# Patient Record
Sex: Female | Born: 1945 | Race: Black or African American | Hispanic: No | Marital: Married | Smoking: Former smoker
Health system: Southern US, Community
[De-identification: ages and names within clinical notes are randomized; demographics above are authoritative.]

## PROBLEM LIST (undated history)

## (undated) DIAGNOSIS — H903 Sensorineural hearing loss, bilateral: Secondary | ICD-10-CM

## (undated) DIAGNOSIS — K7460 Unspecified cirrhosis of liver: Secondary | ICD-10-CM

## (undated) DIAGNOSIS — I10 Essential (primary) hypertension: Secondary | ICD-10-CM

## (undated) DIAGNOSIS — E119 Type 2 diabetes mellitus without complications: Secondary | ICD-10-CM

## (undated) DIAGNOSIS — I361 Nonrheumatic tricuspid (valve) insufficiency: Secondary | ICD-10-CM

## (undated) DIAGNOSIS — R718 Other abnormality of red blood cells: Secondary | ICD-10-CM

## (undated) DIAGNOSIS — I509 Heart failure, unspecified: Secondary | ICD-10-CM

## (undated) DIAGNOSIS — M8580 Other specified disorders of bone density and structure, unspecified site: Secondary | ICD-10-CM

## (undated) DIAGNOSIS — M4800 Spinal stenosis, site unspecified: Secondary | ICD-10-CM

## (undated) DIAGNOSIS — R251 Tremor, unspecified: Secondary | ICD-10-CM

## (undated) DIAGNOSIS — J45909 Unspecified asthma, uncomplicated: Secondary | ICD-10-CM

## (undated) DIAGNOSIS — E785 Hyperlipidemia, unspecified: Secondary | ICD-10-CM

## (undated) DIAGNOSIS — F410 Panic disorder [episodic paroxysmal anxiety] without agoraphobia: Secondary | ICD-10-CM

## (undated) DIAGNOSIS — Z8673 Personal history of transient ischemic attack (TIA), and cerebral infarction without residual deficits: Secondary | ICD-10-CM

## (undated) DIAGNOSIS — I639 Cerebral infarction, unspecified: Secondary | ICD-10-CM

## (undated) DIAGNOSIS — G4733 Obstructive sleep apnea (adult) (pediatric): Secondary | ICD-10-CM

## (undated) DIAGNOSIS — Z9884 Bariatric surgery status: Secondary | ICD-10-CM

## (undated) DIAGNOSIS — K219 Gastro-esophageal reflux disease without esophagitis: Secondary | ICD-10-CM

## (undated) DIAGNOSIS — M109 Gout, unspecified: Secondary | ICD-10-CM

## (undated) DIAGNOSIS — F32A Depression, unspecified: Secondary | ICD-10-CM

## (undated) DIAGNOSIS — M545 Low back pain, unspecified: Secondary | ICD-10-CM

## (undated) DIAGNOSIS — M19049 Primary osteoarthritis, unspecified hand: Secondary | ICD-10-CM

## (undated) DIAGNOSIS — L03115 Cellulitis of right lower limb: Secondary | ICD-10-CM

## (undated) DIAGNOSIS — E559 Vitamin D deficiency, unspecified: Secondary | ICD-10-CM

## (undated) DIAGNOSIS — I4891 Unspecified atrial fibrillation: Secondary | ICD-10-CM

## (undated) DIAGNOSIS — I272 Pulmonary hypertension, unspecified: Secondary | ICD-10-CM

## (undated) DIAGNOSIS — T7840XA Allergy, unspecified, initial encounter: Secondary | ICD-10-CM

## (undated) DIAGNOSIS — F419 Anxiety disorder, unspecified: Secondary | ICD-10-CM

## (undated) DIAGNOSIS — G8929 Other chronic pain: Secondary | ICD-10-CM

## (undated) HISTORY — DX: Other abnormality of red blood cells: R71.8

## (undated) HISTORY — DX: Type 2 diabetes mellitus without complications: E11.9

## (undated) HISTORY — DX: Essential (primary) hypertension: I10

## (undated) HISTORY — DX: Hyperlipidemia, unspecified: E78.5

## (undated) HISTORY — PX: LAPAROSCOPIC GASTRIC BANDING: SHX1100

## (undated) HISTORY — DX: Pulmonary hypertension, unspecified: I27.20

## (undated) HISTORY — DX: Unspecified asthma, uncomplicated: J45.909

## (undated) HISTORY — DX: Nonrheumatic tricuspid (valve) insufficiency: I36.1

## (undated) HISTORY — PX: COLOSTOMY: SHX63

## (undated) HISTORY — DX: Vitamin D deficiency, unspecified: E55.9

## (undated) HISTORY — DX: Sensorineural hearing loss, bilateral: H90.3

## (undated) HISTORY — DX: Gastro-esophageal reflux disease without esophagitis: K21.9

## (undated) HISTORY — DX: Heart failure, unspecified: I50.9

## (undated) HISTORY — PX: UPPER GI ENDOSCOPY: SHX6162

## (undated) HISTORY — DX: Anxiety disorder, unspecified: F41.9

## (undated) HISTORY — DX: Allergy, unspecified, initial encounter: T78.40XA

## (undated) HISTORY — DX: Other specified disorders of bone density and structure, unspecified site: M85.80

## (undated) HISTORY — DX: Panic disorder (episodic paroxysmal anxiety): F41.0

## (undated) HISTORY — DX: Obstructive sleep apnea (adult) (pediatric): G47.33

## (undated) HISTORY — DX: Low back pain, unspecified: M54.50

## (undated) HISTORY — DX: Cerebral infarction, unspecified: I63.9

## (undated) HISTORY — PX: LAPAROSCOPIC CHOLECYSTECTOMY: SUR755

## (undated) HISTORY — DX: Tremor, unspecified: R25.1

## (undated) HISTORY — DX: Depression, unspecified: F32.A

## (undated) HISTORY — DX: Other chronic pain: G89.29

## (undated) HISTORY — DX: Primary osteoarthritis, unspecified hand: M19.049

## (undated) HISTORY — DX: Bariatric surgery status: Z98.84

## (undated) HISTORY — DX: Personal history of transient ischemic attack (TIA), and cerebral infarction without residual deficits: Z86.73

## (undated) HISTORY — DX: Unspecified cirrhosis of liver: K74.60

## (undated) HISTORY — DX: Cellulitis of right lower limb: L03.115

## (undated) HISTORY — DX: Spinal stenosis, site unspecified: M48.00

---

## 2006-03-08 ENCOUNTER — Ambulatory Visit (HOSPITAL_COMMUNITY): Admission: RE | Admit: 2006-03-08 | Discharge: 2006-03-08 | Payer: Self-pay | Admitting: General Surgery

## 2006-03-12 ENCOUNTER — Encounter: Admission: RE | Admit: 2006-03-12 | Discharge: 2006-06-10 | Payer: Self-pay | Admitting: General Surgery

## 2006-03-12 ENCOUNTER — Ambulatory Visit (HOSPITAL_COMMUNITY): Admission: RE | Admit: 2006-03-12 | Discharge: 2006-03-12 | Payer: Self-pay | Admitting: General Surgery

## 2006-03-29 ENCOUNTER — Ambulatory Visit (HOSPITAL_COMMUNITY): Admission: RE | Admit: 2006-03-29 | Discharge: 2006-03-29 | Payer: Self-pay | Admitting: General Surgery

## 2006-08-21 ENCOUNTER — Encounter: Admission: RE | Admit: 2006-08-21 | Discharge: 2006-09-08 | Payer: Self-pay | Admitting: General Surgery

## 2006-09-04 ENCOUNTER — Ambulatory Visit (HOSPITAL_COMMUNITY): Admission: RE | Admit: 2006-09-04 | Discharge: 2006-09-05 | Payer: Self-pay | Admitting: General Surgery

## 2006-10-25 ENCOUNTER — Encounter: Admission: RE | Admit: 2006-10-25 | Discharge: 2006-10-25 | Payer: Self-pay | Admitting: General Surgery

## 2006-12-06 ENCOUNTER — Encounter: Admission: RE | Admit: 2006-12-06 | Discharge: 2006-12-06 | Payer: Self-pay | Admitting: General Surgery

## 2007-03-12 ENCOUNTER — Encounter: Admission: RE | Admit: 2007-03-12 | Discharge: 2007-03-12 | Payer: Self-pay | Admitting: General Surgery

## 2010-05-01 ENCOUNTER — Encounter: Payer: Self-pay | Admitting: General Surgery

## 2010-08-23 NOTE — Op Note (Signed)
Annette, Moore           ACCOUNT NO.:  [id number]   MEDICAL RECORD NO.:  [id number]          PATIENT TYPE:  AMB   LOCATION:  DAY                          FACILITY:  [facility]   PHYSICIAN:  Sharlet Salina T. Hoxworth, M.D.DATE OF BIRTH:  12/28/1946   DATE OF PROCEDURE:  09/04/2006  DATE OF DISCHARGE:                               OPERATIVE REPORT   PREOPERATIVE DIAGNOSIS:  Morbid obesity.   POSTOPERATIVE DIAGNOSIS:  Morbid obesity.   SURGICAL PROCEDURES:  Placement laparoscopic adjustable gastric band.   SURGEON:  Dr. Johna Sheriff   ASSISTANT:  Dr. Baruch Merl   ANESTHESIA:  General.   BRIEF HISTORY:  [name] is a 65 year old female with longstanding  morbid obesity unresponsive to medical management and comorbidities of  hypertension and GE reflux disease.  She presents with a BMI of 53.  She  has had extensive education discussion regarding surgical treatment  options and elected proceed with laparoscopic adjustable gastric band  placement.  The nature procedure and risks have been discussed  extensively detailed elsewhere.  She now brought to operating room for  this procedure.   DESCRIPTION OF OPERATION:  The patient brought to operating room placed  in supine position on the table and general orotracheal anesthesia was  induced.  PAS were placed.  She received heparin subcutaneously  preoperatively.  The abdomen was widely sterilely prepped and draped.  She received preoperative IV antibiotics.  Correct patient procedure  were verified.  Local anesthesia was used to infiltrate the trocar sites  prior to the incisions.  1 cm incision was made in left subcostal space  and access obtained without difficulty using an 11 mm OptiVu trocar and  pneumoperitoneum established.  There were noted to be some moderately  extensive omental adhesions up near the midline from the site of  previous tubal ligation incision.  A 15 mm trocar was placed just  beneath the falciform ligament  in the right upper quadrant.  A little  lower than usual as the left lobe of the liver was fairly large.  Using  the camera through this port a 5 mm trocar was placed in the left flank  and using this trocar and the camera back on the left side.  The omental  adhesions were taken down from the anterior abdominal wall under direct  vision with scissors.  Following this was a 11 mm trocar was placed in  the right upper abdomen, additional 11 mm trocar just to the left lobe  of the umbilicus for camera port.  Through 11-mm site subxiphoid  [facility] liver retractor was placed.  The left lobe was quite large but  were able to get out with adequate exposure of the hiatus and upper  stomach with the [facility] retractor.  There were however quite a few  filmy adhesions of the stomach up to the diaphragm and the left lobe of  the liver.  These were of uncertain origin.  She never had upper  abdominal surgery.  The spleen was visualized and appeared fibrotic and  somewhat encapsulated.  However, after taking these adhesions down the  GE junction and angle of  His were identified and peritoneum overlying  the left crus was incised using the finger dissector careful blunt  dissection was carried back down along the left crus toward the  retrogastric space without difficulty.  Following this the pars flaccida  was divided.  An avascular plane and the base of the right crus  identified.  Small opening was made in the retroperitoneum just anterior  to the right crus at area of crossing fat and the finger dissector was  then passed without any resistance behind the stomach and deployed up to  the previously dissected area over the left crus without difficulty.  Following this a flushed APS lap band system was introduced in the  abdominal cavity.  The tubing placed through the finger dissector which  was then brought back behind the stomach and the band passed  retrogastric without difficulty.  Tubing was  placed through the buckle  and the buckle secured with the calibration tube in place without  difficulty.  The calibration tube was removed.  Following this the  fundus of the stomach was imbricated up over the band to the small  gastric pouch with good visualization of the pouch using interrupted 2-0  Ethibond sutures.  A gastrogastric plication suture was placed inferior  to the pouch with 2-0 Ethibond.  The band appeared to be in excellent  position.  Viscera were carefully inspected for injury and none was  seen.  The liver retractor was removed.  The tubing was brought up  through the right anterior trocar site and all CO2 was evacuated and  trocars removed.  This incision was lengthened somewhat and anterior  fascia exposed.  The tubing was trimmed to size and attached the port.  The port was then sutured to the anterior fascia with four interrupted 2-  0 Prolene sutures.  Subcu at this site was closed with running 3-0  Vicryl and skin incisions were closed with staples.  Counts correct.  Dressings were applied and the patient taken recovery in good condition.      Lorne Skeens. Hoxworth, M.D.  Electronically Signed     BTH/MEDQ  D:  09/04/2006  T:  09/04/2006  Job:  [id number]

## 2013-08-26 ENCOUNTER — Ambulatory Visit (INDEPENDENT_AMBULATORY_CARE_PROVIDER_SITE_OTHER): Payer: Self-pay | Admitting: General Surgery

## 2013-09-30 ENCOUNTER — Encounter (INDEPENDENT_AMBULATORY_CARE_PROVIDER_SITE_OTHER): Payer: Self-pay | Admitting: General Surgery

## 2013-10-16 ENCOUNTER — Encounter (INDEPENDENT_AMBULATORY_CARE_PROVIDER_SITE_OTHER): Payer: BC Managed Care – PPO | Admitting: General Surgery

## 2013-10-29 ENCOUNTER — Encounter (INDEPENDENT_AMBULATORY_CARE_PROVIDER_SITE_OTHER): Payer: Self-pay | Admitting: General Surgery

## 2013-10-29 ENCOUNTER — Ambulatory Visit (INDEPENDENT_AMBULATORY_CARE_PROVIDER_SITE_OTHER): Payer: Medicare Other | Admitting: General Surgery

## 2013-10-29 DIAGNOSIS — Z9884 Bariatric surgery status: Secondary | ICD-10-CM

## 2013-10-29 NOTE — Progress Notes (Signed)
Chief complaint: Followup obesity and LAP-BAND status  History: The patient returns for followup of lap band placed in May of 2008 after a long lapse in followup. She was followed from 2008 until January of 2010. She had some issues early on regarding weight loss due to multiple factors. We were initially somewhat concerned she might have a leak from her port but on her last followup all the fluid was present and she had good restriction. However at that time she had 2 ongoing concerns which were dietary habits and inability to exercise. The patient has gouty arthritis which continues to be a significant problem for her and she has a fair amount of difficulty walking much less getting any exercise. At her visit 5 years ago she was eating a lot of starches and soft food. 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  She states she would often rather drink soft drinks been eat solid food. On questioning she is unable to eat bread or to meat or any roughage due to restriction from her band. She will fill up appropriately on solid protein when she eats this.  Past Medical History  Diagnosis Date  . Hypertension   . Allergy   . Asthma   . Anxiety    History reviewed. No pertinent past surgical history. No current outpatient prescriptions on file.   No current facility-administered medications for this visit.   Allergies  Allergen Reactions  . Sulfa Antibiotics    History  Substance Use Topics  . Smoking status: Former Smoker    Types: Cigarettes    Quit date: 10/29/1976  . Smokeless tobacco: Not on file  . Alcohol Use: No   Exam: BP 132/72  Pulse 77  Temp(Src) 97.5 F (36.4 C)  Ht 5\' 2"  (1.575 m)  Wt 279 lb (126.554 kg)  BMI 51.02 kg/m2 21 pound weight loss from surgery, up 7 pounds from last visit. Lowest weight in 2009 was 257 pounds. General: Morbidly obese African American female no distress Lungs: Clear equal breath sounds  bilaterally Abdomen: Soft and nontender. Port site looks fine. Extremities trace edema with chronic venous stasis changes  Assessment and plan: Status post lap band with poor weight loss which appears to be related both to inability to exercise and dietary choices. She understands this perfectly well. On questioning she seems to have some significant restriction and aerobic there is any point in pushing the band unless we can eliminate liquid sugar from her diet. She states at this point she is committed to try to eliminate this from her diet and not keep any in-house. I told her if she can accomplish this once step I think she will immediately lose some weight. I'll plan to see her back in 3 months.

## 2013-10-29 NOTE — Patient Instructions (Signed)
Our plan is that she remove all soda from the house and avoid this strictly for the next 3 months. Return for followup at that time.

## 2014-01-22 ENCOUNTER — Encounter (INDEPENDENT_AMBULATORY_CARE_PROVIDER_SITE_OTHER): Payer: Medicare Other | Admitting: General Surgery

## 2014-11-09 DIAGNOSIS — I4891 Unspecified atrial fibrillation: Secondary | ICD-10-CM

## 2014-11-09 HISTORY — DX: Unspecified atrial fibrillation: I48.91

## 2016-01-11 ENCOUNTER — Emergency Department (HOSPITAL_BASED_OUTPATIENT_CLINIC_OR_DEPARTMENT_OTHER)
Admission: EM | Admit: 2016-01-11 | Discharge: 2016-01-11 | Disposition: A | Discharge status: Home / Self Care | Payer: Medicare Other | Attending: Emergency Medicine | Admitting: Emergency Medicine

## 2016-01-11 ENCOUNTER — Encounter (HOSPITAL_BASED_OUTPATIENT_CLINIC_OR_DEPARTMENT_OTHER): Payer: Self-pay | Admitting: *Deleted

## 2016-01-11 DIAGNOSIS — I10 Essential (primary) hypertension: Secondary | ICD-10-CM | POA: Insufficient documentation

## 2016-01-11 DIAGNOSIS — J45909 Unspecified asthma, uncomplicated: Secondary | ICD-10-CM | POA: Diagnosis not present

## 2016-01-11 DIAGNOSIS — M10071 Idiopathic gout, right ankle and foot: Secondary | ICD-10-CM | POA: Insufficient documentation

## 2016-01-11 DIAGNOSIS — Z87891 Personal history of nicotine dependence: Secondary | ICD-10-CM | POA: Insufficient documentation

## 2016-01-11 DIAGNOSIS — Z79899 Other long term (current) drug therapy: Secondary | ICD-10-CM | POA: Insufficient documentation

## 2016-01-11 DIAGNOSIS — Z7901 Long term (current) use of anticoagulants: Secondary | ICD-10-CM | POA: Diagnosis not present

## 2016-01-11 DIAGNOSIS — M79671 Pain in right foot: Secondary | ICD-10-CM | POA: Diagnosis present

## 2016-01-11 DIAGNOSIS — M109 Gout, unspecified: Secondary | ICD-10-CM

## 2016-01-11 HISTORY — DX: Gout, unspecified: M10.9

## 2016-01-11 MED ORDER — OXYCODONE-ACETAMINOPHEN 5-325 MG PO TABS: 1.0000 | ORAL_TABLET | Freq: Three times a day (TID) | ORAL | 0 refills | Status: DC | PRN

## 2016-01-11 MED ORDER — PREDNISONE 50 MG PO TABS
60.0000 mg | ORAL_TABLET | Freq: Once | ORAL | Status: AC
  Administered 2016-01-11: 60 mg via ORAL
  Filled 2016-01-11: qty 1

## 2016-01-11 MED ORDER — COLCHICINE 0.6 MG PO TABS: 0.6000 mg | ORAL_TABLET | Freq: Every day | ORAL | 0 refills | Status: AC

## 2016-01-11 MED ORDER — PREDNISONE 20 MG PO TABS: ORAL_TABLET | ORAL | 0 refills | Status: DC

## 2016-01-11 MED ORDER — COLCHICINE 0.6 MG PO TABS
0.6000 mg | ORAL_TABLET | Freq: Once | ORAL | Status: AC
  Administered 2016-01-11: 0.6 mg via ORAL
  Filled 2016-01-11: qty 1

## 2016-01-11 MED ORDER — HYDROMORPHONE HCL 1 MG/ML IJ SOLN
1.0000 mg | Freq: Once | INTRAMUSCULAR | Status: AC
  Administered 2016-01-11: 1 mg via INTRAMUSCULAR
  Filled 2016-01-11: qty 1

## 2016-01-11 NOTE — Discharge Instructions (Signed)
It appears from my research that prednisone may or may not effect how well your Coumadin works. The recommendations are to get your INR checked 3-4 days after starting the Coumadin. You need to make an appointment with whoever prescribes your Coumadin and have this done at the end of this week. If you have onset of fevers, chills, severe nausea or vomiting, worsening redness or spreading redness please return to the emergency department for repeat evaluation.

## 2016-01-11 NOTE — ED Notes (Signed)
MD at bedside. 

## 2016-01-11 NOTE — ED Triage Notes (Signed)
Pt c/o right foot pain x 2 days which started in great toe , hx gout

## 2016-01-11 NOTE — ED Notes (Signed)
MD at bedside. [place] 

## 2016-01-11 NOTE — ED Provider Notes (Signed)
[facility]   CSN: [id number] Arrival date & time: 01/11/16  1901 By signing my name below, I, Annette Moore, attest that this documentation has been prepared under the direction and in the presence of No att. providers found . Electronically Signed: Levon Moore, Scribe. 01/11/2016. 8:35 PM.   History   Chief Complaint Chief Complaint  Patient presents with  . Foot Pain   HPI Annette Moore is a 70 y.o. female with hx of gout, HTN, and DM who presents to the Emergency Department complaining of worsening right great toe pain onset last week. Her pain significantly worsened three days ago. She notes associated redness and swelling to her right foot and leg. Pt has taken Vicodin with no relief. In the past, pt will take extra colchicine when she has flares; due to this, she has run out of colchicine and has not taken any during this episode. Pt has had gout in this toe before and states this feels like prior episodes. Pt's legs are swollen, which is normal for her. She is no longer taking Lasix because it causes her fatigue. She is followed by Dr. Leonette Most. She has no other complaints at this time.    The history is provided by the patient. No language interpreter was used.    Past Medical History:  Diagnosis Date  . Allergy   . Anxiety   . Asthma   . Gout   . Hypertension    Patient Active Problem List   Diagnosis Date Noted  . Morbid obesity (HCC) 10/29/2013  . LAP-BAND surgery status 10/29/2013   History reviewed. No pertinent surgical history.  OB History    No data available     Home Medications    Prior to Admission medications   Medication Sig Start Date End Date Taking? Authorizing Provider  metoprolol tartrate (LOPRESSOR) 25 MG tablet Take 25 mg by mouth 2 (two) times daily.   Yes Historical Provider, MD  warfarin (COUMADIN) 2 MG tablet Take 2 mg by mouth daily.   Yes Historical Provider, MD  warfarin (COUMADIN) 5 MG tablet Take 5 mg by  mouth daily.   Yes Historical Provider, MD  colchicine 0.6 MG tablet Take 1 tablet (0.6 mg total) by mouth daily. 01/11/16   Marily Memos, MD  oxyCODONE-acetaminophen (PERCOCET) 5-325 MG tablet Take 1-2 tablets by mouth every 8 (eight) hours as needed for severe pain. 01/11/16   Marily Memos, MD  predniSONE (DELTASONE) 20 MG tablet 3 tabs po daily x 3 days, then 2 tabs x 3 days, then 1.5 tabs x 3 days, then 1 tab x 3 days, then 0.5 tabs x 3 days 01/11/16   Marily Memos, MD    Family History Family History  Problem Relation Age of Onset  . Heart disease Mother   . Heart disease Father     Social History Social History  Substance Use Topics  . Smoking status: Former Smoker    Types: Cigarettes    Quit date: 10/29/1976  . Smokeless tobacco: Not on file  . Alcohol use No    Allergies   Sulfa antibiotics  Review of Systems Review of Systems  Cardiovascular: Positive for leg swelling.  Musculoskeletal: Positive for myalgias.  All other systems reviewed and are negative.  Physical Exam Updated Vital Signs BP 137/82 (BP Location: Left Arm)   Pulse 84   Temp 98.1 F (36.7 C) (Oral)   Resp 16   Ht 5\' 2"  (1.575 m)   Wt 270  lb (122.5 kg)   SpO2 100%   BMI 49.38 kg/m   Physical Exam  Constitutional: She is oriented to person, place, and time. She appears well-developed and well-nourished. No distress.  HENT:  Head: Normocephalic and atraumatic.  Eyes: Conjunctivae are normal.  Cardiovascular: Normal rate and regular rhythm.   Pulmonary/Chest: Effort normal. No respiratory distress. She has no wheezes. She has no rales.  Abdominal: She exhibits no distension.  Musculoskeletal: She exhibits edema.  Right first MTP is swollen, red; pain with ROM. Not warm, not indurated, not fluctuated. BLE are significantly swollen, 1+ pitting edema to the knees   Neurological: She is alert and oriented to person, place, and time.  Skin: Skin is warm and dry.  Psychiatric: She has a normal mood  and affect.  Nursing note and vitals reviewed.  ED Treatments / Results  DIAGNOSTIC STUDIES:  Oxygen Saturation is 100% on RA, normal by my interpretation.    COORDINATION OF CARE:  8:32 PM Discussed treatment plan with pt at bedside and pt agreed to plan.  Labs (all labs ordered are listed, but only abnormal results are displayed) Labs Reviewed - No data to display  EKG  EKG Interpretation None       Radiology No results found.  Procedures Procedures (including critical care time)  Medications Ordered in ED Medications  colchicine tablet 0.6 mg (0.6 mg Oral Given 01/11/16 2016)  HYDROmorphone (DILAUDID) injection 1 mg (1 mg Intramuscular Given 01/11/16 2016)  predniSONE (DELTASONE) tablet 60 mg (60 mg Oral Given 01/11/16 2016)     Initial Impression / Assessment and Plan / ED Course  I have reviewed the triage vital signs and the nursing notes.  Pertinent labs & imaging results that were available during my care of the patient were reviewed by me and considered in my medical decision making (see chart for details).  Clinical Course    Likely gout. Doubt septic arthritis. No e/o DVT. Will treat for gout, PCP follow up.   Final Clinical Impressions(s) / ED Diagnoses   Final diagnoses:  Acute gout involving toe of right foot, unspecified cause    New Prescriptions Discharge Medication List as of 01/11/2016  8:43 PM    START taking these medications   Details  oxyCODONE-acetaminophen (PERCOCET) 5-325 MG tablet Take 1-2 tablets by mouth every 8 (eight) hours as needed for severe pain., Starting Tue 01/11/2016, Print    predniSONE (DELTASONE) 20 MG tablet 3 tabs po daily x 3 days, then 2 tabs x 3 days, then 1.5 tabs x 3 days, then 1 tab x 3 days, then 0.5 tabs x 3 days, Print       I personally performed the services described in this documentation, which was scribed in my presence. The recorded information has been reviewed and is accurate.     Marily MemosJason Hennessey Cantrell,  MD 01/12/16 [phone]

## 2016-02-11 ENCOUNTER — Emergency Department (HOSPITAL_BASED_OUTPATIENT_CLINIC_OR_DEPARTMENT_OTHER): Payer: Medicare Other

## 2016-02-11 ENCOUNTER — Emergency Department (HOSPITAL_BASED_OUTPATIENT_CLINIC_OR_DEPARTMENT_OTHER)
Admission: EM | Admit: 2016-02-11 | Discharge: 2016-02-11 | Disposition: A | Discharge status: Home / Self Care | Payer: Medicare Other | Attending: Emergency Medicine | Admitting: Emergency Medicine

## 2016-02-11 ENCOUNTER — Encounter (HOSPITAL_BASED_OUTPATIENT_CLINIC_OR_DEPARTMENT_OTHER): Payer: Self-pay | Admitting: *Deleted

## 2016-02-11 DIAGNOSIS — Z79899 Other long term (current) drug therapy: Secondary | ICD-10-CM | POA: Diagnosis not present

## 2016-02-11 DIAGNOSIS — R05 Cough: Secondary | ICD-10-CM | POA: Diagnosis present

## 2016-02-11 DIAGNOSIS — J45909 Unspecified asthma, uncomplicated: Secondary | ICD-10-CM | POA: Diagnosis not present

## 2016-02-11 DIAGNOSIS — I10 Essential (primary) hypertension: Secondary | ICD-10-CM | POA: Diagnosis not present

## 2016-02-11 DIAGNOSIS — Z87891 Personal history of nicotine dependence: Secondary | ICD-10-CM | POA: Diagnosis not present

## 2016-02-11 DIAGNOSIS — J189 Pneumonia, unspecified organism: Secondary | ICD-10-CM | POA: Diagnosis not present

## 2016-02-11 DIAGNOSIS — I4891 Unspecified atrial fibrillation: Secondary | ICD-10-CM | POA: Diagnosis not present

## 2016-02-11 HISTORY — DX: Unspecified atrial fibrillation: I48.91

## 2016-02-11 LAB — CBC WITH DIFFERENTIAL/PLATELET
BASOS ABS: 0 10*3/uL (ref 0.0–0.1)
BASOS PCT: 0 %
Eosinophils Absolute: 0.1 10*3/uL (ref 0.0–0.7)
Eosinophils Relative: 1 %
HEMATOCRIT: 37.3 % (ref 36.0–46.0)
HEMOGLOBIN: 11.7 g/dL — AB (ref 12.0–15.0)
Lymphocytes Relative: 17 %
Lymphs Abs: 1.8 10*3/uL (ref 0.7–4.0)
MCH: 22.9 pg — ABNORMAL LOW (ref 26.0–34.0)
MCHC: 31.4 g/dL (ref 30.0–36.0)
MCV: 72.9 fL — ABNORMAL LOW (ref 78.0–100.0)
MONO ABS: 1.5 10*3/uL — AB (ref 0.1–1.0)
Monocytes Relative: 14 %
NEUTROS ABS: 7.4 10*3/uL (ref 1.7–7.7)
NEUTROS PCT: 68 %
Platelets: 242 10*3/uL (ref 150–400)
RBC: 5.12 MIL/uL — AB (ref 3.87–5.11)
RDW: 16.1 % — AB (ref 11.5–15.5)
WBC: 10.8 10*3/uL — AB (ref 4.0–10.5)

## 2016-02-11 LAB — BASIC METABOLIC PANEL
ANION GAP: 9 (ref 5–15)
BUN: 10 mg/dL (ref 6–20)
CALCIUM: 9 mg/dL (ref 8.9–10.3)
CO2: 25 mmol/L (ref 22–32)
Chloride: 104 mmol/L (ref 101–111)
Creatinine, Ser: 0.82 mg/dL (ref 0.44–1.00)
Glucose, Bld: 107 mg/dL — ABNORMAL HIGH (ref 65–99)
Potassium: 3.8 mmol/L (ref 3.5–5.1)
SODIUM: 138 mmol/L (ref 135–145)

## 2016-02-11 LAB — BRAIN NATRIURETIC PEPTIDE: B Natriuretic Peptide: 512.5 pg/mL — ABNORMAL HIGH (ref 0.0–100.0)

## 2016-02-11 LAB — TROPONIN I

## 2016-02-11 MED ORDER — IBUPROFEN 800 MG PO TABS
800.0000 mg | ORAL_TABLET | Freq: Once | ORAL | Status: DC
  Filled 2016-02-11: qty 1

## 2016-02-11 MED ORDER — SODIUM CHLORIDE 0.9 % IV BOLUS (SEPSIS)
500.0000 mL | Freq: Once | INTRAVENOUS | Status: AC
  Administered 2016-02-11: 500 mL via INTRAVENOUS

## 2016-02-11 MED ORDER — LEVOFLOXACIN IN D5W 750 MG/150ML IV SOLN
750.0000 mg | Freq: Once | INTRAVENOUS | Status: AC
  Administered 2016-02-11: 750 mg via INTRAVENOUS
  Filled 2016-02-11: qty 150

## 2016-02-11 MED ORDER — LEVOFLOXACIN 750 MG PO TABS: 750.0000 mg | ORAL_TABLET | Freq: Every day | ORAL | 0 refills | Status: DC

## 2016-02-11 MED ORDER — ACETAMINOPHEN 500 MG PO TABS
1000.0000 mg | ORAL_TABLET | Freq: Once | ORAL | Status: AC
  Administered 2016-02-11: 1000 mg via ORAL
  Filled 2016-02-11: qty 2

## 2016-02-11 NOTE — Discharge Instructions (Signed)
Levaquin as prescribed.  Return to the emergency department for worsening breathing, chest pains, or other new and concerning symptoms.

## 2016-02-11 NOTE — ED Notes (Signed)
ED Provider at bedside. 

## 2016-02-11 NOTE — ED Notes (Signed)
Lower extremities are edematous bilat.  Pitting edema noted in feet and ankles bialt. 2-3 plus pitting edema.

## 2016-02-11 NOTE — ED Notes (Signed)
Pt is getting dressed as she wishes to go home.  Will d/c once paperwork is ready

## 2016-02-11 NOTE — ED Notes (Signed)
Family at bedside. 

## 2016-02-11 NOTE — ED Notes (Signed)
Pt ambulated to hall and back to bed using a walker. Oxygen sat 98% after walking. Family states pt does minimal walking at home at baseline. MD made aware and pt wanting to go home.

## 2016-02-11 NOTE — Progress Notes (Signed)
Placed patient on a 2 liter nasal cannula to maintain a SPO2 greater than or equal to 95%.  Patient's SPO2 increased and remained at 98% and her WOB has decreased.  RT will continue to monitor.

## 2016-02-11 NOTE — ED Triage Notes (Addendum)
Pt reports head pain, cough, no fever >99.  Cough noted in triage.  Fever in triage.  Tylenol taken at 1pm today.

## 2016-02-11 NOTE — ED Notes (Signed)
Assisted patient in sitting on side of bed to get comfortable, when patient sat up patient HR went to 160's came down to 130's after a min. Patient advised not to stand up without assistance, RN Misty and Dr. Judd Lienelo made aware. Patient given some Ginger ale and crackers.

## 2016-02-11 NOTE — ED Provider Notes (Signed)
[facility]   CSN: [id number] Arrival date & time: 02/11/16  1711     History   Chief Complaint Chief Complaint  Patient presents with  . Generalized Body Aches    HPI Annette Moore is a 70 y.o. female.  Patient is a 70 year old female with past medical history of hypertension, paroxysmal A. fib, anxiety. She presents for evaluation of chest congestion, cough, and generalized malaise which has been worsening over the past week. She is reporting bloody sputum. She has felt fevers at home but has not taken her temperature. She denies any ill contacts.   The history is provided by the patient.  Cough  This is a new problem. Episode onset: 1 week ago. The problem occurs constantly. The cough is productive of sputum. The maximum temperature recorded prior to her arrival was 101 to 101.9 F. Pertinent negatives include no chest pain and no shortness of breath. She has tried nothing for the symptoms.    Past Medical History:  Diagnosis Date  . Allergy   . Anxiety   . Asthma   . Atrial fibrillation (HCC)   . Gout   . Hypertension     Patient Active Problem List   Diagnosis Date Noted  . Morbid obesity (HCC) 10/29/2013  . 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 No pertinent surgical history.  OB History    No data available       Home Medications    Prior to Admission medications   Medication Sig Start Date End Date Taking? Authorizing Provider  colchicine 0.6 MG tablet Take 1 tablet (0.6 mg total) by mouth daily. 01/11/16   Marily MemosJason Mesner, MD  metoprolol tartrate (LOPRESSOR) 25 MG tablet Take 25 mg by mouth 2 (two) times daily.    Historical Provider, MD  warfarin (COUMADIN) 2 MG tablet Take 2 mg by mouth daily.    Historical Provider, MD  warfarin (COUMADIN) 5 MG tablet Take 5 mg by mouth daily.    Historical Provider, MD    Family History Family History  Problem Relation Age of Onset  . Heart disease Mother   . Heart  disease Father     Social History Social History  Substance Use Topics  . Smoking status: Former Smoker    Types: Cigarettes    Quit date: 10/29/1976  . Smokeless tobacco: Never Used  . Alcohol use No     Allergies   Ibuprofen and Sulfa antibiotics   Review of Systems Review of Systems  Respiratory: Positive for cough. Negative for shortness of breath.   Cardiovascular: Negative for chest pain.  All other systems reviewed and are negative.    Physical Exam Updated Vital Signs BP 160/87 (BP Location: Left Arm) Comment: noncompliant with BP meds today  Pulse (!) 130   Temp 101.3 F (38.5 C) (Oral)   Resp 22   SpO2 96%   Physical Exam  Constitutional: She is oriented to person, place, and time. She appears well-developed and well-nourished. No distress.  HENT:  Head: Normocephalic and atraumatic.  Neck: Normal range of motion. Neck supple.  Cardiovascular: Normal rate and regular rhythm.  Exam reveals no gallop and no friction rub.   No murmur heard. Pulmonary/Chest: Effort normal. No respiratory distress. She has no wheezes. She has rales.  There are rales in the left base.  Abdominal: Soft. Bowel sounds are normal. She exhibits no distension. There is no tenderness.  Musculoskeletal: Normal range of motion.  Neurological: She is  alert and oriented to person, place, and time.  Skin: Skin is warm and dry. She is not diaphoretic.  Nursing note and vitals reviewed.    ED Treatments / Results  Labs (all labs ordered are listed, but only abnormal results are displayed) Labs Reviewed  BASIC METABOLIC PANEL  CBC WITH DIFFERENTIAL/PLATELET  BRAIN NATRIURETIC PEPTIDE  TROPONIN I    EKG  EKG Interpretation None       Radiology Dg Chest 2 View  Result Date: 02/11/2016 CLINICAL DATA:  Fever coughing up blood with congestion EXAM: CHEST  2 VIEW COMPARISON:  06/20/2015 FINDINGS: There is stable moderate cardiomegaly. There is mild central vascular congestion.  There is diffuse increase in interstitial and alveolar opacities which may relate to edema. There is small or focal opacity within the left lung base and lingula, which may relate to an infiltrate. There is no pneumothorax. No large effusion. IMPRESSION: 1. Stable cardiomegaly.  There is mild central congestion. 2. Diffuse interstitial and alveolar opacities could relate to mild edema however there is more focal airspace opacity within the lingula and left base which may relate to an infiltrate. Electronically Signed   By: Jasmine PangKim  Fujinaga M.D.   On: 02/11/2016 17:48    Procedures Procedures (including critical care time)  Medications Ordered in ED Medications  ibuprofen (ADVIL,MOTRIN) tablet 800 mg (not administered)  acetaminophen (TYLENOL) tablet 1,000 mg (not administered)     Initial Impression / Assessment and Plan / ED Course  I have reviewed the triage vital signs and the nursing notes.  Pertinent labs & imaging results that were available during my care of the patient were reviewed by me and considered in my medical decision making (see chart for details).  Clinical Course    Patient was brought by family for evaluation of fever and cough. She also is weaker than normal. Her workup reveals what appears to be an infiltrate in the lingula. She is not hypoxic or in any respiratory distress. She is somewhat tachycardic and she is in atrial fibrillation. It is my understanding that this is baseline for her. She will be given an IV dose of Levaquin.   After receiving this antibiotic, she was reassessed and is adamant about not being admitted to the hospital. She was ambulated and appeared to do as well as she normally does at home area she did not become hypoxic or overly tachycardic. I had a long discussion with the family regarding admission versus discharge. They have agreed to take Annette Moore home, treat with Levaquin, and return if she experiences any other new or worsening  problems.  Final Clinical Impressions(s) / ED Diagnoses   Final diagnoses:  None    New Prescriptions New Prescriptions   No medications on file     Geoffery Lyonsouglas Mirely Pangle, MD 02/11/16 2207

## 2016-11-08 HISTORY — PX: CARDIAC CATHETERIZATION: SHX172

## 2019-09-07 ENCOUNTER — Encounter (HOSPITAL_BASED_OUTPATIENT_CLINIC_OR_DEPARTMENT_OTHER): Payer: Self-pay | Admitting: *Deleted

## 2019-09-07 ENCOUNTER — Other Ambulatory Visit: Payer: Self-pay

## 2019-09-07 ENCOUNTER — Emergency Department (HOSPITAL_BASED_OUTPATIENT_CLINIC_OR_DEPARTMENT_OTHER)
Admission: EM | Admit: 2019-09-07 | Discharge: 2019-09-07 | Disposition: A | Discharge status: Home / Self Care | Payer: Medicare PPO | Attending: Emergency Medicine | Admitting: Emergency Medicine

## 2019-09-07 DIAGNOSIS — W228XXA Striking against or struck by other objects, initial encounter: Secondary | ICD-10-CM | POA: Insufficient documentation

## 2019-09-07 DIAGNOSIS — Z7901 Long term (current) use of anticoagulants: Secondary | ICD-10-CM | POA: Diagnosis not present

## 2019-09-07 DIAGNOSIS — Y929 Unspecified place or not applicable: Secondary | ICD-10-CM | POA: Insufficient documentation

## 2019-09-07 DIAGNOSIS — I10 Essential (primary) hypertension: Secondary | ICD-10-CM | POA: Diagnosis not present

## 2019-09-07 DIAGNOSIS — Y9389 Activity, other specified: Secondary | ICD-10-CM | POA: Insufficient documentation

## 2019-09-07 DIAGNOSIS — Y999 Unspecified external cause status: Secondary | ICD-10-CM | POA: Insufficient documentation

## 2019-09-07 DIAGNOSIS — Z79899 Other long term (current) drug therapy: Secondary | ICD-10-CM | POA: Insufficient documentation

## 2019-09-07 DIAGNOSIS — J45909 Unspecified asthma, uncomplicated: Secondary | ICD-10-CM | POA: Insufficient documentation

## 2019-09-07 DIAGNOSIS — Z23 Encounter for immunization: Secondary | ICD-10-CM | POA: Insufficient documentation

## 2019-09-07 DIAGNOSIS — Z87891 Personal history of nicotine dependence: Secondary | ICD-10-CM | POA: Insufficient documentation

## 2019-09-07 DIAGNOSIS — S81811A Laceration without foreign body, right lower leg, initial encounter: Secondary | ICD-10-CM | POA: Insufficient documentation

## 2019-09-07 DIAGNOSIS — S86921A Laceration of unspecified muscle(s) and tendon(s) at lower leg level, right leg, initial encounter: Secondary | ICD-10-CM | POA: Insufficient documentation

## 2019-09-07 MED ORDER — HYDROCODONE-ACETAMINOPHEN 5-325 MG PO TABS
1.0000 | ORAL_TABLET | Freq: Once | ORAL | Status: AC
  Administered 2019-09-07: 1 via ORAL
  Filled 2019-09-07: qty 1

## 2019-09-07 MED ORDER — HYDROCODONE-ACETAMINOPHEN 5-325 MG PO TABS: ORAL_TABLET | ORAL | 0 refills | Status: DC

## 2019-09-07 MED ORDER — TETANUS-DIPHTH-ACELL PERTUSSIS 5-2.5-18.5 LF-MCG/0.5 IM SUSP
0.5000 mL | Freq: Once | INTRAMUSCULAR | Status: AC
  Administered 2019-09-07: 0.5 mL via INTRAMUSCULAR
  Filled 2019-09-07: qty 0.5

## 2019-09-07 MED ORDER — ONDANSETRON 4 MG PO TBDP
4.0000 mg | ORAL_TABLET | Freq: Once | ORAL | Status: AC
  Administered 2019-09-07: 4 mg via ORAL
  Filled 2019-09-07: qty 1

## 2019-09-07 MED ORDER — LIDOCAINE-EPINEPHRINE (PF) 2 %-1:200000 IJ SOLN
10.0000 mL | Freq: Once | INTRAMUSCULAR | Status: AC
  Administered 2019-09-07: 10 mL
  Filled 2019-09-07: qty 10

## 2019-09-07 NOTE — Discharge Instructions (Addendum)
Please read and follow all provided instructions.  Your diagnoses today include:  1. Laceration of right lower leg with tendon involvement, initial encounter     Tests performed today include:  X-ray of the affected area that did not show any foreign bodies or broken bones  Vital signs. See below for your results today.   Medications prescribed:   Vicodin (hydrocodone/acetaminophen) - narcotic pain medication  DO NOT drive or perform any activities that require you to be awake and alert because this medicine can make you drowsy. BE VERY CAREFUL not to take multiple medicines containing Tylenol (also called acetaminophen). Doing so can lead to an overdose which can damage your liver and cause liver failure and possibly death.  Take any prescribed medications only as directed.   Home care instructions:  Follow any educational materials and wound care instructions contained in this packet.   Keep affected area above the level of your heart when possible to minimize swelling. Wash area gently twice a day with warm soapy water. Do not apply alcohol or hydrogen peroxide. Cover the area if it draining or weeping.   Follow-up instructions: Suture Removal: Return to the Emergency Department or see your primary care care doctor in 14 days for a recheck of your wound and removal of your sutures or staples.    Return instructions:  Return to the Emergency Department if you have:  Fever  Worsening pain  Worsening swelling of the wound  Pus draining from the wound  Redness of the skin that moves away from the wound, especially if it streaks away from the affected area   Any other emergent concerns  Your vital signs today were: BP (!) 155/97 (BP Location: Right Arm)   Pulse 62   Temp 97.7 F (36.5 C) (Oral)   Resp 18   Ht 5' 2.5" (1.588 m)   Wt 115.7 kg   SpO2 98%   BMI 45.90 kg/m  If your blood pressure (BP) was elevated above 135/85 this visit, please have this repeated by  your doctor within one month. -------------

## 2019-09-07 NOTE — ED Provider Notes (Signed)
[facility] EMERGENCY DEPARTMENT Provider Note   CSN: [id number] Arrival date & time: 09/07/19  1504     History Chief Complaint  Patient presents with  . Extremity Laceration    Annette Moore is a 74 y.o. female.  Patient presents to the emergency department for a laceration on the posterior right calf sustained earlier today.  Patient was getting out of the wheelchair and cut herself on a piece of metal.  She had some bleeding that has stopped.  She is on Coumadin for atrial fibrillation.  She denies any distal numbness or tingling.  The area is generally sore.  Pain is worse with palpation and movement.  No other injuries reported.        Past Medical History:  Diagnosis Date  . Allergy   . Anxiety   . Asthma   . Atrial fibrillation (HCC)   . Gout   . Hypertension     Patient Active Problem List   Diagnosis Date Noted  . Morbid obesity (HCC) 10/29/2013  . 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 No pertinent surgical history.   OB History   No obstetric history on file.     Family History  Problem Relation Age of Onset  . Heart disease Mother   . Heart disease Father     Social History   Tobacco Use  . Smoking status: Former Smoker    Types: Cigarettes    Quit date: 10/29/1976    Years since quitting: 42.8  . Smokeless tobacco: Never Used  Substance Use Topics  . Alcohol use: No  . Drug use: No    Home Medications Prior to Admission medications   Medication Sig Start Date End Date Taking? Authorizing Provider  colchicine 0.6 MG tablet Take 1 tablet (0.6 mg total) by mouth daily. 01/11/16   Mesner, Barbara Cower, MD  levofloxacin (LEVAQUIN) 750 MG tablet Take 1 tablet (750 mg total) by mouth daily. X 7 days 02/11/16   Geoffery Lyons, MD  metoprolol tartrate (LOPRESSOR) 25 MG tablet Take 25 mg by mouth 2 (two) times daily.    [provider]  warfarin (COUMADIN) 2 MG tablet Take 2 mg by mouth daily.    [provider]  warfarin (COUMADIN) 5 MG tablet Take 5 mg by mouth daily.    [provider]    Allergies    Ibuprofen and Sulfa antibiotics  Review of Systems   Review of Systems  Constitutional: Negative for activity change.  Musculoskeletal: Positive for myalgias. Negative for arthralgias, back pain, joint swelling and neck pain.  Skin: Positive for wound.  Neurological: Negative for weakness and numbness.    Physical Exam Updated Vital Signs BP (!) 155/97 (BP Location: Right Arm)   Pulse 62   Temp 97.7 F (36.5 C) (Oral)   Resp 18   Ht 5' 2.5" (1.588 m)   Wt 115.7 kg   SpO2 98%   BMI 45.90 kg/m   Physical Exam Vitals and nursing note reviewed.  Constitutional:      Appearance: She is well-developed.  HENT:     Head: Normocephalic and atraumatic.  Eyes:     Conjunctiva/sclera: Conjunctivae normal.  Pulmonary:     Effort: No respiratory distress.  Musculoskeletal:     Cervical back: Normal range of motion and neck supple.  Skin:    General: Skin is warm and dry.     Comments: Patient with 3 cm L-shaped laceration to the posterior right calf.  Wound extends into the subcutaneous tissue.  Wound base is clean.  Very minimal venous ooze.  No foreign bodies noted.  Neurological:     Mental Status: She is alert.     ED Results / Procedures / Treatments   Labs (all labs ordered are listed, but only abnormal results are displayed) Labs Reviewed - No data to display  EKG None  Radiology No results found.  Procedures .Marland KitchenLaceration Repair  Date/Time: 09/07/2019 5:46 PM Performed by: Carlisle Cater, PA-C Authorized by: Carlisle Cater, PA-C   Consent:    Consent obtained:  Verbal   Consent given by:  Patient   Risks discussed:  Infection, pain, vascular damage, poor wound healing, poor cosmetic result and need for additional repair   Alternatives discussed:  No treatment Anesthesia (see MAR for exact dosages):    Anesthesia method:  Local  infiltration   Local anesthetic:  Lidocaine 2% WITH epi Laceration details:    Location:  Leg   Leg location:  R lower leg   Length (cm):  3 Repair type:    Repair type:  Simple Pre-procedure details:    Preparation:  Patient was prepped and draped in usual sterile fashion Exploration:    Hemostasis achieved with:  Epinephrine and direct pressure   Wound exploration: wound explored through full range of motion and entire depth of wound probed and visualized     Wound extent: no foreign bodies/material noted     Contaminated: no   Treatment:    Irrigation solution:  Sterile saline   Irrigation volume:  500cc   Irrigation method:  Pressure wash   Visualized foreign bodies/material removed: no   Skin repair:    Repair method:  Sutures   Suture size:  4-0   Suture material:  Nylon   Suture technique:  Simple interrupted   Number of sutures:  5 Approximation:    Approximation:  Close Post-procedure details:    Dressing:  Open (no dressing)   Patient tolerance of procedure:  Tolerated well, no immediate complications   (including critical care time)  Medications Ordered in ED Medications  HYDROcodone-acetaminophen (NORCO/VICODIN) 5-325 MG per tablet 1 tablet (1 tablet Oral Given 09/07/19 1625)  lidocaine-EPINEPHrine (XYLOCAINE W/EPI) 2 %-1:200000 (PF) injection 10 mL (10 mLs Infiltration Given 09/07/19 1625)  ondansetron (ZOFRAN-ODT) disintegrating tablet 4 mg (4 mg Oral Given 09/07/19 1731)  Tdap (BOOSTRIX) injection 0.5 mL (0.5 mLs Intramuscular Given 09/07/19 1743)    ED Course  I have reviewed the triage vital signs and the nursing notes.  Pertinent labs & imaging results that were available during my care of the patient were reviewed by me and considered in my medical decision making (see chart for details).  Patient seen and examined. Medications ordered.   Vital signs reviewed and are as follows: BP (!) 155/97 (BP Location: Right Arm)   Pulse 62   Temp 97.7 F (36.5  C) (Oral)   Resp 18   Ht 5' 2.5" (1.588 m)   Wt 115.7 kg   SpO2 98%   BMI 45.90 kg/m   5:48 PM Patient counseled on wound care. Patient counseled on need to return or see PCP/urgent care for suture removal in 10-14 days. Patient was urged to return to the Emergency Department urgently with worsening pain, swelling, expanding erythema especially if it streaks away from the affected area, fever, or if they have any other concerns. Patient verbalized understanding.   Patient counseled on use of narcotic pain medications. Counseled not to combine  these medications with others containing tylenol. Urged not to drink alcohol, drive, or perform any other activities that requires focus while taking these medications. The patient verbalizes understanding and agrees with the plan.  Tetanus updated.     Clinical Course as of Sep 06 1741  Sun Sep 07, 2019  1658 74 yo female w/ chf on lasix, on A/C presenting with right posterior calf skin tear, accidentally scraped it against her wheelchair.  She has a subcutaneous fat tear in a V-shape approx 2 cm in total length in posterior lower calf.  Plan for PA washout and laceration repair.  No sign of infection at this time.   [MT]    Clinical Course User Index [MT] Terald Sleeper, MD   MDM Rules/Calculators/A&P                      Patient with uncomplicated appearing laceration to the right posterior calf.  This was cleaned and repaired without complication.  Tetanus updated.  Patient to perform wound care at home and follow-up with PCP as needed.  We discussed signs and symptoms which should cause return.   Final Clinical Impression(s) / ED Diagnoses Final diagnoses:  Laceration of right lower leg with tendon involvement, initial encounter    Rx / DC Orders ED Discharge Orders         Ordered    HYDROcodone-acetaminophen (NORCO/VICODIN) 5-325 MG tablet     09/07/19 1728           Renne Crigler, PA-C 09/07/19 1750    Terald Sleeper, MD 09/07/19 [phone]

## 2019-09-07 NOTE — ED Triage Notes (Signed)
Per EMS: pt has a small skin tear to right calf today. Bleeding controlled. VSS

## 2020-02-25 ENCOUNTER — Emergency Department (HOSPITAL_BASED_OUTPATIENT_CLINIC_OR_DEPARTMENT_OTHER)
Admission: EM | Admit: 2020-02-25 | Discharge: 2020-02-26 | Disposition: A | Discharge status: Home / Self Care | Payer: Medicare PPO | Attending: Emergency Medicine | Admitting: Emergency Medicine

## 2020-02-25 ENCOUNTER — Other Ambulatory Visit: Payer: Self-pay

## 2020-02-25 ENCOUNTER — Encounter (HOSPITAL_BASED_OUTPATIENT_CLINIC_OR_DEPARTMENT_OTHER): Payer: Self-pay | Admitting: *Deleted

## 2020-02-25 ENCOUNTER — Emergency Department (HOSPITAL_BASED_OUTPATIENT_CLINIC_OR_DEPARTMENT_OTHER): Payer: Medicare PPO

## 2020-02-25 DIAGNOSIS — I5033 Acute on chronic diastolic (congestive) heart failure: Secondary | ICD-10-CM | POA: Diagnosis present

## 2020-02-25 DIAGNOSIS — I509 Heart failure, unspecified: Secondary | ICD-10-CM

## 2020-02-25 DIAGNOSIS — I110 Hypertensive heart disease with heart failure: Secondary | ICD-10-CM | POA: Diagnosis not present

## 2020-02-25 DIAGNOSIS — I5023 Acute on chronic systolic (congestive) heart failure: Secondary | ICD-10-CM | POA: Diagnosis not present

## 2020-02-25 DIAGNOSIS — Z7901 Long term (current) use of anticoagulants: Secondary | ICD-10-CM | POA: Diagnosis not present

## 2020-02-25 DIAGNOSIS — J9601 Acute respiratory failure with hypoxia: Secondary | ICD-10-CM | POA: Diagnosis not present

## 2020-02-25 DIAGNOSIS — J90 Pleural effusion, not elsewhere classified: Secondary | ICD-10-CM | POA: Diagnosis not present

## 2020-02-25 DIAGNOSIS — Z87891 Personal history of nicotine dependence: Secondary | ICD-10-CM | POA: Insufficient documentation

## 2020-02-25 DIAGNOSIS — Z79899 Other long term (current) drug therapy: Secondary | ICD-10-CM | POA: Insufficient documentation

## 2020-02-25 DIAGNOSIS — I4891 Unspecified atrial fibrillation: Secondary | ICD-10-CM | POA: Diagnosis not present

## 2020-02-25 DIAGNOSIS — R059 Cough, unspecified: Secondary | ICD-10-CM | POA: Diagnosis present

## 2020-02-25 DIAGNOSIS — J45909 Unspecified asthma, uncomplicated: Secondary | ICD-10-CM | POA: Diagnosis not present

## 2020-02-25 DIAGNOSIS — Z20822 Contact with and (suspected) exposure to covid-19: Secondary | ICD-10-CM | POA: Insufficient documentation

## 2020-02-25 LAB — BASIC METABOLIC PANEL
Anion gap: 9 (ref 5–15)
BUN: 7 mg/dL — ABNORMAL LOW (ref 8–23)
CO2: 30 mmol/L (ref 22–32)
Calcium: 9.4 mg/dL (ref 8.9–10.3)
Chloride: 101 mmol/L (ref 98–111)
Creatinine, Ser: 0.72 mg/dL (ref 0.44–1.00)
GFR, Estimated: >60 mL/min (ref >60)
Glucose, Bld: 99 mg/dL (ref 70–99)
Potassium: 3.8 mmol/L (ref 3.5–5.1)
Sodium: 140 mmol/L (ref 135–145)

## 2020-02-25 LAB — CBC WITH DIFFERENTIAL/PLATELET
Abs Immature Granulocytes: 0.04 10*3/uL (ref 0.00–0.07)
Basophils Absolute: 0 10*3/uL (ref 0.0–0.1)
Basophils Relative: 0 %
Eosinophils Absolute: 0.3 10*3/uL (ref 0.0–0.5)
Eosinophils Relative: 4 %
HCT: 40.8 % (ref 36.0–46.0)
Hemoglobin: 12 g/dL (ref 12.0–15.0)
Immature Granulocytes: 1 %
Lymphocytes Relative: 21 %
Lymphs Abs: 1.6 10*3/uL (ref 0.7–4.0)
MCH: 23 pg — ABNORMAL LOW (ref 26.0–34.0)
MCHC: 29.4 g/dL — ABNORMAL LOW (ref 30.0–36.0)
MCV: 78.2 fL — ABNORMAL LOW (ref 80.0–100.0)
Monocytes Absolute: 1 10*3/uL (ref 0.1–1.0)
Monocytes Relative: 12 %
Neutro Abs: 4.9 10*3/uL (ref 1.7–7.7)
Neutrophils Relative %: 62 %
Platelets: 243 10*3/uL (ref 150–400)
RBC: 5.22 MIL/uL — ABNORMAL HIGH (ref 3.87–5.11)
RDW: 16.6 % — ABNORMAL HIGH (ref 11.5–15.5)
WBC: 7.8 10*3/uL (ref 4.0–10.5)
nRBC: 0 % (ref 0.0–0.2)

## 2020-02-25 LAB — PROTIME-INR
INR: 1.5 — ABNORMAL HIGH (ref 0.8–1.2)
Prothrombin Time: 17.8 seconds — ABNORMAL HIGH (ref 11.4–15.2)

## 2020-02-25 LAB — RESPIRATORY PANEL BY RT PCR (FLU A&B, COVID)
Influenza A by PCR: NEGATIVE
Influenza B by PCR: NEGATIVE
SARS Coronavirus 2 by RT PCR: NEGATIVE

## 2020-02-25 LAB — BRAIN NATRIURETIC PEPTIDE: B Natriuretic Peptide: 177 pg/mL — ABNORMAL HIGH (ref 0.0–100.0)

## 2020-02-25 LAB — TROPONIN I (HIGH SENSITIVITY)
Troponin I (High Sensitivity): 15 ng/L (ref <18)
Troponin I (High Sensitivity): 15 ng/L (ref <18)

## 2020-02-25 MED ORDER — POTASSIUM CHLORIDE CRYS ER 20 MEQ PO TBCR
20.0000 meq | EXTENDED_RELEASE_TABLET | Freq: Once | ORAL | Status: AC
  Administered 2020-02-25: 20 meq via ORAL
  Filled 2020-02-25: qty 1

## 2020-02-25 MED ORDER — FUROSEMIDE 10 MG/ML IJ SOLN
40.0000 mg | Freq: Once | INTRAMUSCULAR | Status: AC
  Administered 2020-02-25: 40 mg via INTRAVENOUS
  Filled 2020-02-25: qty 4

## 2020-02-25 NOTE — ED Provider Notes (Signed)
[facility] EMERGENCY DEPARTMENT Provider Note   CSN: [id number] Arrival date & time: 02/25/20  1827     History Chief Complaint  Patient presents with  . Cough    Annette Moore is a 74 y.o. female.  Presents with cough, shortness of breath. SOB worse with exertion, lying flat. No CP. No fever. Sent by UC for CXR. Has chronic leg swelling, denies significant change from prior. Hx afib, CVA, HTN, MO, OSA. Per review of chart, echo in Aug 2021 with pulm htn, normal EF.  HPI     Past Medical History:  Diagnosis Date  . Allergy   . Anxiety   . Asthma   . Atrial fibrillation (HCC)   . Gout   . Hypertension     Patient Active Problem List   Diagnosis Date Noted  . Morbid obesity (HCC) 10/29/2013  . 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 No pertinent surgical history.   OB History   No obstetric history on file.     Family History  Problem Relation Age of Onset  . Heart disease Mother   . Heart disease Father     Social History   Tobacco Use  . Smoking status: Former Smoker    Types: Cigarettes    Quit date: 10/29/1976    Years since quitting: 43.3  . Smokeless tobacco: Never Used  Substance Use Topics  . Alcohol use: No  . Drug use: No    Home Medications Prior to Admission medications   Medication Sig Start Date End Date Taking? Authorizing Provider  colchicine 0.6 MG tablet Take 1 tablet (0.6 mg total) by mouth daily. 01/11/16   Mesner, Barbara Cower, MD  HYDROcodone-acetaminophen (NORCO/VICODIN) 5-325 MG tablet Take 1-2 tablets every 6 hours as needed for severe pain 09/07/19   Renne Crigler, PA-C  levofloxacin (LEVAQUIN) 750 MG tablet Take 1 tablet (750 mg total) by mouth daily. X 7 days 02/11/16   Geoffery Lyons, MD  metoprolol tartrate (LOPRESSOR) 25 MG tablet Take 25 mg by mouth 2 (two) times daily.    [provider]  warfarin (COUMADIN) 2 MG tablet Take 2 mg by mouth daily.    [provider]  warfarin  (COUMADIN) 5 MG tablet Take 5 mg by mouth daily.    [provider]    Allergies    Codeine, Ibuprofen, and Sulfa antibiotics  Review of Systems   Review of Systems  Constitutional: Negative for chills and fever.  HENT: Negative for ear pain and sore throat.   Eyes: Negative for pain and visual disturbance.  Respiratory: Positive for shortness of breath. Negative for cough.   Cardiovascular: Negative for chest pain and palpitations.  Gastrointestinal: Negative for abdominal pain and vomiting.  Genitourinary: Negative for dysuria and hematuria.  Musculoskeletal: Negative for arthralgias and back pain.  Skin: Negative for color change and rash.  Neurological: Negative for seizures and syncope.  All other systems reviewed and are negative.   Physical Exam Updated Vital Signs BP (!) 142/87 (BP Location: Right Arm)   Pulse 72   Temp 98.3 F (36.8 C) (Oral)   Resp 19   Ht 5\' 2"  (1.575 m)   Wt 109.8 kg   SpO2 100%   BMI 44.26 kg/m   Physical Exam Vitals and nursing note reviewed.  Constitutional:      General: She is not in acute distress.    Appearance: She is well-developed.  HENT:     Head: Normocephalic and atraumatic.  Eyes:     Conjunctiva/sclera: Conjunctivae normal.  Cardiovascular:     Rate and Rhythm: Normal rate and regular rhythm.     Heart sounds: No murmur heard.   Pulmonary:     Comments: Mild tachypnea, no distress, diminished sounds at bases Abdominal:     Palpations: Abdomen is soft.     Tenderness: There is no abdominal tenderness.  Musculoskeletal:     Cervical back: Neck supple.     Comments: B/l pitting edema  Skin:    General: Skin is warm and dry.  Neurological:     General: No focal deficit present.     Mental Status: She is alert.     ED Results / Procedures / Treatments   Labs (all labs ordered are listed, but only abnormal results are displayed) Labs Reviewed  PROTIME-INR - Abnormal; Notable for the following components:       Result Value   Prothrombin Time 17.8 (*)    INR 1.5 (*)    All other components within normal limits  CBC WITH DIFFERENTIAL/PLATELET - Abnormal; Notable for the following components:   RBC 5.22 (*)    MCV 78.2 (*)    MCH 23.0 (*)    MCHC 29.4 (*)    RDW 16.6 (*)    All other components within normal limits  BASIC METABOLIC PANEL - Abnormal; Notable for the following components:   BUN 7 (*)    All other components within normal limits  BRAIN NATRIURETIC PEPTIDE - Abnormal; Notable for the following components:   B Natriuretic Peptide 177.0 (*)    All other components within normal limits  RESPIRATORY PANEL BY RT PCR (FLU A&B, COVID)  TROPONIN I (HIGH SENSITIVITY)  TROPONIN I (HIGH SENSITIVITY)    EKG None  Radiology DG Chest 2 View  Result Date: 02/25/2020 CLINICAL DATA:  Cough EXAM: CHEST - 2 VIEW COMPARISON:  02/11/2016 FINDINGS: Moderate right pleural effusion and small left pleural effusion. Bibasilar airspace opacities. Heart is borderline in size. No acute bony abnormality. IMPRESSION: Bilateral pleural effusions and bibasilar atelectasis or infiltrates, right greater than left. Electronically Signed   By: Charlett Nose M.D.   On: 02/25/2020 19:29    Procedures Procedures (including critical care time)  Medications Ordered in ED Medications  furosemide (LASIX) injection 40 mg (40 mg Intravenous Given 02/25/20 2133)  potassium chloride SA (KLOR-CON) CR tablet 20 mEq (20 mEq Oral Given 02/25/20 2133)    ED Course  I have reviewed the triage vital signs and the nursing notes.  Pertinent labs & imaging results that were available during my care of the patient were reviewed by me and considered in my medical decision making (see chart for details).    MDM Rules/Calculators/A&P                          74 y/o lady with sob. CXR with moderate right and small left pleural effusion. EKG nonischemic, trop wnl. Bnp mildly elevated. No fever. Suspect related to heart  failrue, pulm htn. Pt did quite poorly on amb trial. Hypoxia and tachypnea with minimal effort. Believe patient would benefit from admission for trial of IV diuresis, further observation.   Consulted TR[facility]. Dr. Antionette Char will accept.  Final Clinical Impression(s) / ED Diagnoses Final diagnoses:  Acute on chronic heart failure, unspecified heart failure type (HCC)  Acute respiratory failure with hypoxia (HCC)  Pleural effusion    Rx / DC Orders ED Discharge Orders  None       Milagros Loll, MD 02/25/20 2324

## 2020-02-25 NOTE — Discharge Instructions (Addendum)
Hospital admission had been recommended tonight, but you are choosing to leave against medical advice. If you change your mind, please return at any time.  In the mean time, please increase your furosemide (Lasix) dose to 80 mg once a day for the next four days.After that, you will need to consult with your doctor whether to stay on the higher dose, or return to your usual dose.

## 2020-02-25 NOTE — ED Provider Notes (Signed)
Patient has decided she does not want to be admitted, wants to leave AMA. Risks of hypoxia, heart attack explained, she still wants to leave. Patient advised to double her dose of furosemide, follow up with PCP. Advised to return at any time if she changes her mind.   Dione Booze, MD 02/26/20 0010

## 2020-02-25 NOTE — ED Notes (Signed)
Pt states she wants to go home and follow up with her doctor tomorrow, ER doctor notified

## 2020-02-25 NOTE — ED Triage Notes (Addendum)
Coughing with lightheadedness since Sunday.  Had Covid test done at [facility] today, negative.  Denies fever, n/v.  Advised by UC staff to come here for  Chest xray.

## 2020-02-25 NOTE — ED Notes (Signed)
Ambulated from restroom to Pt's room and sats dropped to 86% heart rate was 121, doctor notified

## 2020-04-10 HISTORY — PX: LAPAROSCOPIC CHOLECYSTECTOMY: SUR755

## 2020-07-27 ENCOUNTER — Ambulatory Visit: Payer: Medicare PPO | Admitting: Pulmonary Disease

## 2020-07-27 ENCOUNTER — Ambulatory Visit (INDEPENDENT_AMBULATORY_CARE_PROVIDER_SITE_OTHER): Payer: Medicare PPO

## 2020-07-27 ENCOUNTER — Other Ambulatory Visit: Payer: Self-pay

## 2020-07-27 ENCOUNTER — Encounter: Payer: Self-pay | Admitting: Pulmonary Disease

## 2020-07-27 VITALS — BP 128/82 | HR 78 | Temp 97.3°F | Ht 62.5 in

## 2020-07-27 DIAGNOSIS — J90 Pleural effusion, not elsewhere classified: Secondary | ICD-10-CM

## 2020-07-27 DIAGNOSIS — R0609 Other forms of dyspnea: Secondary | ICD-10-CM

## 2020-07-27 DIAGNOSIS — R0683 Snoring: Secondary | ICD-10-CM | POA: Diagnosis not present

## 2020-07-27 DIAGNOSIS — R0600 Dyspnea, unspecified: Secondary | ICD-10-CM

## 2020-07-27 DIAGNOSIS — I272 Pulmonary hypertension, unspecified: Secondary | ICD-10-CM | POA: Diagnosis not present

## 2020-07-27 NOTE — Progress Notes (Signed)
[facility]  Chief Complaint  Patient presents with  . Consult    Sent by PCP for concern for sleep apnea, pt experiencing increased SHOB and has to increase rescue inhaler use ( since January)    Constitutional:  BP 128/82 (BP Location: Left Arm, Patient Position: Sitting, Cuff Size: Normal)   Pulse 78   Temp (!) 97.3 F (36.3 C) (Skin)   Ht 5' 2.5" (1.588 m)   SpO2 97%   BMI 43.56 kg/m   Past Medical History:  GERD, Anxiety, DM type 2, Permanent atrial fibrillation, Ischemic CVA, Asthma, HLD, HTN, s/p gastric band, OSA, Allergic rhinitis, Chronic back pain with spinal stenosis, Osteopenia, Depression, Vit D deficiency, Gout, Rt leg cellulitis, Tremor, Cirrhosis  Past Surgical History:  She  has a past surgical history that includes Colostomy; Laparoscopic cholecystectomy; Upper gi endoscopy; and Laparoscopic gastric banding.  Brief Summary:  Annette Moore is a 75 y.o. female former smoker with obstructive sleep apnea, dyspnea, and pulmonary hypertension.      Subjective:   Her son is present with her during this visit.  She is followed by cardiology at [facility] for diastolic CHF and atrial fibrillation.  She has persistent dyspnea.  She has pulmonary hypertension.  There is concern that she has sleep apnea contributing to her pulmonary hypertension.  She had a sleep study done several decades ago.  She was started on a huge machine, and felt like the mask smothered her.  She sent the machine back after a couple of weeks.  She continues to have trouble with her sleep.  She falls asleep and then wakes up.  She snores.  She can't sleep laying flat on her back.  She will fall asleep watching TV.  She uses a wheelchair since she had a stroke.  She gets winded easily with activity.  She quit smoking in the 1980's.  She was told she has asthma.  She hasn't had breathing tests done recently.    Physical Exam:   Appearance - well kempt    ENMT - no sinus tenderness, no oral exudate, no LAN, Mallampati 4 airway, no stridor  Respiratory - decreased breath sounds at bases Rt > Lt, no wheezing or rales  CV - s1s2 regular rate and rhythm, no murmurs  Ext - no clubbing, 1+ lower leg edema  Skin - no rashes  Psych - normal mood and affect   Pulmonary testing:   Rt thoracentesis 12/04/16 >> 700 ml fluid, glucose 135, LDH 327, 486 WBC (58%M, 26%L, 9%N), cytology negative  Chest Imaging:   CT angio chest 04/16/20 >> main PA 3.7 cm, moderate Rt pleural effusion  Sleep Tests:    Cardiac Tests:   Doppler legs b/l 04/21/20 >> no DVT  Echo 05/27/20 >> mild LVH, EF 65 to 70%, mod TR, RVSP 96 mmHg  Social History:  She  reports that she quit smoking about 43 years ago. Her smoking use included cigarettes. She has never used smokeless tobacco. She reports that she does not drink alcohol and does not use drugs.  Family History:  Her family history includes Heart disease in her father and mother.    Discussion:  She has pulmonary hypertension.  This is likely multifactorial.  She has diastolic CHF in setting of atrial fibrillation.  She has prior history of smoking, reported history of asthma, and could have obstructive lung disease as a contributor.  She has recurrent right pleural effusion that likely is contributing  to dyspnea and possible ventilation/perfusion mismatching.  She also has history of obstructive sleep apnea, and likely still has sleep disordered breathing.  Finally, she has history of cirrhosis.  Assessment/Plan:   Obstructive sleep apnea. - discussed how sleep apnea can impact her health - will arrange for home sleep study to assess further  History of asthma and tobacco abuse. - will arrange for pulmonary function test to assess for obstructive lung disease - continue prn albuterol for now  Recurrent right pleural effusion. - will arrange for chest xray  Chronic diastolic CHF, permanent atrial  fibrillation, pulmonary hypertension. - followed by Dr. Tollie Pizza with cardiology at [facility]  Dysphagia, GERD, cirrhosis. - followed by Dr. Clovis Pu with gastroenterology at [facility]  Time Spent Involved in Patient Care on Day of Examination:  67 minutes  Follow up:  Patient Instructions  Will have you sign a release form to get medical records from [facility]  Will arrange for chest xray, pulmonary function test, and home sleep study  Will call to arrange for follow up after sleep study reviewed    Medication List:   Allergies as of 07/27/2020      Reactions   Codeine Hives   Itching   Ibuprofen    States that her doctor told her not to take it because of her heart.   Sulfa Antibiotics       Medication List       Accurate as of July 27, 2020  1:32 PM. If you have any questions, ask your nurse or doctor.        STOP taking these medications   HYDROcodone-acetaminophen 5-325 MG tablet Commonly known as: NORCO/VICODIN Stopped by: Coralyn Helling, MD   levofloxacin 750 MG tablet Commonly known as: Levaquin Stopped by: Coralyn Helling, MD   warfarin 2 MG tablet Commonly known as: COUMADIN Stopped by: Coralyn Helling, MD   warfarin 5 MG tablet Commonly known as: COUMADIN Stopped by: Coralyn Helling, MD     TAKE these medications   albuterol (2.5 MG/3ML) 0.083% nebulizer solution Commonly known as: PROVENTIL Inhale into the lungs.   albuterol 108 (90 Base) MCG/ACT inhaler Commonly known as: VENTOLIN HFA Inhale into the lungs.   ALPRAZolam 0.25 MG tablet Commonly known as: XANAX Take by mouth.   atorvastatin 10 MG tablet Commonly known as: LIPITOR Take by mouth.   colchicine 0.6 MG tablet Take 1 tablet (0.6 mg total) by mouth daily.   diltiazem 240 MG 24 hr capsule Commonly known as: TIAZAC Take 240 mg by mouth daily.   Eliquis 5 MG Tabs tablet Generic drug: apixaban TAKE 1 TABLET(5 MG) BY MOUTH TWICE DAILY   gabapentin 100 MG  capsule Commonly known as: NEURONTIN Take by mouth.   metoprolol tartrate 25 MG tablet Commonly known as: LOPRESSOR Take 25 mg by mouth 2 (two) times daily.   Nebulizers Misc Use every 4-6 hours as needed   Vitamin D (Ergocalciferol) 1.25 MG (50000 UNIT) Caps capsule Commonly known as: DRISDOL Take 1 capsule by mouth 2 (two) times a week.       Signature:  Coralyn Helling, MD [facility] Pulmonary/Critical Care Pager - [phone] 07/27/2020, 1:32 PM

## 2020-07-27 NOTE — Patient Instructions (Signed)
Will have you sign a release form to get medical records from [facility]  Will arrange for chest xray, pulmonary function test, and home sleep study  Will call to arrange for follow up after sleep study reviewed

## 2020-07-29 ENCOUNTER — Telehealth: Payer: Self-pay | Admitting: Pulmonary Disease

## 2020-07-29 DIAGNOSIS — J90 Pleural effusion, not elsewhere classified: Secondary | ICD-10-CM

## 2020-07-29 NOTE — Telephone Encounter (Signed)
DG Chest 2 View  Result Date: 07/28/2020 CLINICAL DATA:  Dyspnea on exertion, right pleural effusion EXAM: CHEST - 2 VIEW COMPARISON:  02/25/2020 FINDINGS: Frontal and lateral views of the chest demonstrates stable enlargement of the cardiac silhouette. Stable bilateral pleural effusions, right greater than left. Consolidation at the right lung base likely reflects atelectasis. No pneumothorax. No acute bony abnormalities. IMPRESSION: 1. Stable bilateral pleural effusions, right greater than left. 2. Right basilar consolidation consistent with compressive atelectasis. 3. Stable enlarged cardiac silhouette. Electronically Signed   By: Sharlet Salina M.D.   On: 07/28/2020 11:22    Results d/w pt and her daughter and son over the phone.  Has persistent Rt > Lt effusions.    Will arrange for referral to interventional radiology to assess for right thoracentesis.  Will need to coordinate having her come of eliquis 2 days prior to procedure.  Will send fluid for glucose, LDH, protein, cell count, cytology, and culture.

## 2020-07-30 ENCOUNTER — Ambulatory Visit (HOSPITAL_COMMUNITY): Payer: Medicare PPO

## 2020-08-02 ENCOUNTER — Ambulatory Visit (HOSPITAL_COMMUNITY): Payer: Medicare PPO

## 2020-08-02 ENCOUNTER — Other Ambulatory Visit (HOSPITAL_COMMUNITY)
Admission: RE | Admit: 2020-08-02 | Discharge: 2020-08-02 | Disposition: A | Discharge status: Home / Self Care | Payer: Medicare PPO | Source: Ambulatory Visit | Attending: Pulmonary Disease | Admitting: Pulmonary Disease

## 2020-08-02 ENCOUNTER — Telehealth: Payer: Self-pay | Admitting: Pulmonary Disease

## 2020-08-02 DIAGNOSIS — Z20822 Contact with and (suspected) exposure to covid-19: Secondary | ICD-10-CM | POA: Insufficient documentation

## 2020-08-02 DIAGNOSIS — Z01812 Encounter for preprocedural laboratory examination: Secondary | ICD-10-CM | POA: Diagnosis present

## 2020-08-02 NOTE — Telephone Encounter (Signed)
She had the same type of procedure in 2018 when they drained fluid from around her lung.  She should not take eliquis today (08/02/20), tomorrow (08/03/20), or Wednesday (08/04/20).  She should be able to resume eliquis on 08/05/20.

## 2020-08-02 NOTE — Telephone Encounter (Signed)
Called and spoke with pt and she stated that she is having a procedure on Wednesday to drain the fluid out of her lung.  She stated that she was told to hold her blood thinner today and tomorrow.  She wanted to clarify that this was correct.  Pt stated that she did not really understand this procedure.  VS please advise on the holding of her eliquis.   Thanks   Allergies  Allergen Reactions  . Codeine Hives    Itching  . Ibuprofen     States that her doctor told her not to take it because of her heart.  . Sulfa Antibiotics

## 2020-08-02 NOTE — Telephone Encounter (Signed)
Called spoke with daughter per DPR went over Dr. Evlyn Courier recommendations. Daughter read back to me. Nothing further needed at this time,

## 2020-08-03 LAB — SARS CORONAVIRUS 2 (TAT 6-24 HRS): SARS Coronavirus 2: NEGATIVE

## 2020-08-04 ENCOUNTER — Ambulatory Visit (HOSPITAL_COMMUNITY)
Admission: RE | Admit: 2020-08-04 | Discharge: 2020-08-04 | Disposition: A | Discharge status: Home / Self Care | Payer: Medicare PPO | Source: Ambulatory Visit | Attending: Pulmonary Disease | Admitting: Pulmonary Disease

## 2020-08-04 ENCOUNTER — Other Ambulatory Visit: Payer: Self-pay

## 2020-08-04 ENCOUNTER — Ambulatory Visit (HOSPITAL_COMMUNITY)
Admission: RE | Admit: 2020-08-04 | Discharge: 2020-08-04 | Disposition: A | Discharge status: Home / Self Care | Payer: Medicare PPO | Source: Ambulatory Visit | Attending: Radiology | Admitting: Radiology

## 2020-08-04 DIAGNOSIS — I272 Pulmonary hypertension, unspecified: Secondary | ICD-10-CM | POA: Insufficient documentation

## 2020-08-04 DIAGNOSIS — J90 Pleural effusion, not elsewhere classified: Secondary | ICD-10-CM | POA: Diagnosis present

## 2020-08-04 DIAGNOSIS — I4891 Unspecified atrial fibrillation: Secondary | ICD-10-CM | POA: Insufficient documentation

## 2020-08-04 DIAGNOSIS — Z7901 Long term (current) use of anticoagulants: Secondary | ICD-10-CM | POA: Diagnosis not present

## 2020-08-04 DIAGNOSIS — I509 Heart failure, unspecified: Secondary | ICD-10-CM | POA: Insufficient documentation

## 2020-08-04 DIAGNOSIS — Z9889 Other specified postprocedural states: Secondary | ICD-10-CM | POA: Insufficient documentation

## 2020-08-04 LAB — BODY FLUID CELL COUNT WITH DIFFERENTIAL
Eos, Fluid: 4 %
Lymphs, Fluid: 67 %
Monocyte-Macrophage-Serous Fluid: 27 % — ABNORMAL LOW (ref 50–90)
Neutrophil Count, Fluid: 4 % (ref 0–25)
Total Nucleated Cell Count, Fluid: UNDETERMINED cu mm (ref 0–1000)

## 2020-08-04 LAB — LACTATE DEHYDROGENASE, PLEURAL OR PERITONEAL FLUID: LD, Fluid: 65 U/L — ABNORMAL HIGH (ref 3–23)

## 2020-08-04 LAB — GLUCOSE, PLEURAL OR PERITONEAL FLUID: Glucose, Fluid: 105 mg/dL

## 2020-08-04 LAB — PROTEIN, PLEURAL OR PERITONEAL FLUID: Total protein, fluid: <3 g/dL

## 2020-08-04 MED ORDER — LIDOCAINE HCL 1 % IJ SOLN
INTRAMUSCULAR | Status: AC
  Filled 2020-08-04: qty 20

## 2020-08-04 NOTE — Procedures (Signed)
Ultrasound-guided diagnostic and therapeutic right thoracentesis performed yielding 760 cc of hazy, amber fluid. No immediate complications. Follow-up chest x-ray pending. A portion of the fluid was sent to the lab for preordered studies. EBL< 2 cc.

## 2020-08-05 LAB — CYTOLOGY - NON PAP

## 2020-08-07 LAB — BODY FLUID CULTURE W GRAM STAIN: Culture: NO GROWTH

## 2020-08-12 ENCOUNTER — Telehealth: Payer: Self-pay | Admitting: Pulmonary Disease

## 2020-08-12 NOTE — Telephone Encounter (Signed)
Called and spoke with patient's daughter. She stated that the patient had her thoracentesis on 08/04/20. She was fine after the procedure. She went to get her hair done and slipped out of the salon chair. She injured her back. She took her to the ED to make sure she was ok. Per the ED doctor, all of her CT scans came back normal.   She is concerned that after a week later, she is still having pain on the right side. I explained to her that it will take the chest and back muscles a while to heal not only from the procedure but from the fall. She wanted to make sure that the fluid had not returned. When I asked if she had complaining about any increased SOB, she stated that she wanted to add her to the convo. The call was disconnected.   Will wait for her to call back for more info.

## 2020-08-16 NOTE — Telephone Encounter (Signed)
lmtcb for pt to check on her.

## 2020-08-18 NOTE — Telephone Encounter (Signed)
lmtcb for pt.  

## 2020-08-23 ENCOUNTER — Other Ambulatory Visit (HOSPITAL_COMMUNITY): Payer: Medicare PPO

## 2020-08-26 ENCOUNTER — Ambulatory Visit: Payer: Medicare PPO | Admitting: Adult Health

## 2020-10-23 ENCOUNTER — Other Ambulatory Visit: Payer: Self-pay

## 2020-10-23 ENCOUNTER — Encounter (HOSPITAL_BASED_OUTPATIENT_CLINIC_OR_DEPARTMENT_OTHER): Payer: Self-pay

## 2020-10-23 ENCOUNTER — Emergency Department (HOSPITAL_BASED_OUTPATIENT_CLINIC_OR_DEPARTMENT_OTHER): Payer: Medicare PPO

## 2020-10-23 ENCOUNTER — Emergency Department (HOSPITAL_BASED_OUTPATIENT_CLINIC_OR_DEPARTMENT_OTHER)
Admission: EM | Admit: 2020-10-23 | Discharge: 2020-10-23 | Disposition: A | Discharge status: Other Acute Inpatient Hospital | Payer: Medicare PPO | Attending: Emergency Medicine | Admitting: Emergency Medicine

## 2020-10-23 DIAGNOSIS — Z79899 Other long term (current) drug therapy: Secondary | ICD-10-CM | POA: Diagnosis not present

## 2020-10-23 DIAGNOSIS — I5033 Acute on chronic diastolic (congestive) heart failure: Secondary | ICD-10-CM | POA: Diagnosis not present

## 2020-10-23 DIAGNOSIS — M79674 Pain in right toe(s): Secondary | ICD-10-CM | POA: Diagnosis present

## 2020-10-23 DIAGNOSIS — L03031 Cellulitis of right toe: Secondary | ICD-10-CM | POA: Diagnosis not present

## 2020-10-23 DIAGNOSIS — Z87891 Personal history of nicotine dependence: Secondary | ICD-10-CM | POA: Insufficient documentation

## 2020-10-23 DIAGNOSIS — J45909 Unspecified asthma, uncomplicated: Secondary | ICD-10-CM | POA: Insufficient documentation

## 2020-10-23 DIAGNOSIS — I110 Hypertensive heart disease with heart failure: Secondary | ICD-10-CM | POA: Insufficient documentation

## 2020-10-23 DIAGNOSIS — Z20822 Contact with and (suspected) exposure to covid-19: Secondary | ICD-10-CM | POA: Diagnosis not present

## 2020-10-23 DIAGNOSIS — I4891 Unspecified atrial fibrillation: Secondary | ICD-10-CM | POA: Insufficient documentation

## 2020-10-23 DIAGNOSIS — Z7901 Long term (current) use of anticoagulants: Secondary | ICD-10-CM | POA: Diagnosis not present

## 2020-10-23 LAB — COMPREHENSIVE METABOLIC PANEL
ALT: 16 U/L (ref 0–44)
AST: 35 U/L (ref 15–41)
Albumin: 3.8 g/dL (ref 3.5–5.0)
Alkaline Phosphatase: 123 U/L (ref 38–126)
Anion gap: 9 (ref 5–15)
BUN: 10 mg/dL (ref 8–23)
CO2: 28 mmol/L (ref 22–32)
Calcium: 9.3 mg/dL (ref 8.9–10.3)
Chloride: 101 mmol/L (ref 98–111)
Creatinine, Ser: 0.88 mg/dL (ref 0.44–1.00)
GFR, Estimated: >60 mL/min (ref >60)
Glucose, Bld: 86 mg/dL (ref 70–99)
Potassium: 3.3 mmol/L — ABNORMAL LOW (ref 3.5–5.1)
Sodium: 138 mmol/L (ref 135–145)
Total Bilirubin: 1 mg/dL (ref 0.3–1.2)
Total Protein: 7.8 g/dL (ref 6.5–8.1)

## 2020-10-23 LAB — CBC WITH DIFFERENTIAL/PLATELET
Abs Immature Granulocytes: 0.03 10*3/uL (ref 0.00–0.07)
Basophils Absolute: 0 10*3/uL (ref 0.0–0.1)
Basophils Relative: 0 %
Eosinophils Absolute: 0.3 10*3/uL (ref 0.0–0.5)
Eosinophils Relative: 5 %
HCT: 42.8 % (ref 36.0–46.0)
Hemoglobin: 12.9 g/dL (ref 12.0–15.0)
Immature Granulocytes: 0 %
Lymphocytes Relative: 25 %
Lymphs Abs: 1.7 10*3/uL (ref 0.7–4.0)
MCH: 23.8 pg — ABNORMAL LOW (ref 26.0–34.0)
MCHC: 30.1 g/dL (ref 30.0–36.0)
MCV: 78.8 fL — ABNORMAL LOW (ref 80.0–100.0)
Monocytes Absolute: 1 10*3/uL (ref 0.1–1.0)
Monocytes Relative: 14 %
Neutro Abs: 3.8 10*3/uL (ref 1.7–7.7)
Neutrophils Relative %: 56 %
Platelets: 243 10*3/uL (ref 150–400)
RBC: 5.43 MIL/uL — ABNORMAL HIGH (ref 3.87–5.11)
RDW: 15.8 % — ABNORMAL HIGH (ref 11.5–15.5)
WBC: 6.8 10*3/uL (ref 4.0–10.5)
nRBC: 0 % (ref 0.0–0.2)

## 2020-10-23 LAB — RESP PANEL BY RT-PCR (FLU A&B, COVID) ARPGX2
Influenza A by PCR: NEGATIVE
Influenza B by PCR: NEGATIVE
SARS Coronavirus 2 by RT PCR: NEGATIVE

## 2020-10-23 LAB — LACTIC ACID, PLASMA: Lactic Acid, Venous: 1.1 mmol/L (ref 0.5–1.9)

## 2020-10-23 MED ORDER — VANCOMYCIN HCL IN DEXTROSE 1-5 GM/200ML-% IV SOLN
1000.0000 mg | Freq: Once | INTRAVENOUS | Status: AC
  Administered 2020-10-23: 1000 mg via INTRAVENOUS
  Filled 2020-10-23: qty 200

## 2020-10-23 MED ORDER — SODIUM CHLORIDE 0.9 % IV SOLN
INTRAVENOUS | Status: DC | PRN
  Administered 2020-10-23: 250 mL via INTRAVENOUS

## 2020-10-23 MED ORDER — ONDANSETRON HCL 4 MG/2ML IJ SOLN
4.0000 mg | Freq: Once | INTRAMUSCULAR | Status: AC
  Administered 2020-10-23: 4 mg via INTRAVENOUS
  Filled 2020-10-23: qty 2

## 2020-10-23 MED ORDER — PIPERACILLIN-TAZOBACTAM 3.375 G IVPB 30 MIN
3.3750 g | Freq: Once | INTRAVENOUS | Status: AC
  Administered 2020-10-23: 3.375 g via INTRAVENOUS
  Filled 2020-10-23: qty 50

## 2020-10-23 MED ORDER — FENTANYL CITRATE (PF) 100 MCG/2ML IJ SOLN
50.0000 ug | Freq: Once | INTRAMUSCULAR | Status: AC
  Administered 2020-10-23: 50 ug via INTRAVENOUS
  Filled 2020-10-23: qty 2

## 2020-10-23 MED ORDER — SODIUM CHLORIDE 0.9 % IV BOLUS
500.0000 mL | Freq: Once | INTRAVENOUS | Status: AC
  Administered 2020-10-23: 500 mL via INTRAVENOUS

## 2020-10-23 NOTE — ED Provider Notes (Signed)
[facility] EMERGENCY DEPARTMENT Provider Note   CSN: [id number] Arrival date & time: 10/23/20  1307     History Chief Complaint  Patient presents with   Toe Pain    Annette Moore is a 75 y.o. female.  Annette Moore is a 75 y.o. female with a history of hypertension, hyperlipidemia, CVA, A. fib, cellulitis and gout who presents to ER with complaints of pain, swelling and purulent drainage to the second toe on the right foot.  Patient reports that on Thursday she started having pain in the right foot, and thought this was a gout exacerbation which she has had many times before.  She has been taking colchicine but reports that it has continued to worsen and now she is having purulent drainage and bleeding from the right second toe.  She has never previously had wounds or drainage in the foot.  No previous wound infections or amputations.  She has not had any fevers, chills, vomiting or malaise.  Is not diabetic.  Does have a history of chronic lower extremity swelling that is at baseline.  She reports that the foot has become increasingly painful in particular today, she is typically ambulatory without difficulty but cannot bear any weight on the foot.  No other aggravating or alleviating factors.  The history is provided by the patient and a relative.  Toe Pain Pertinent negatives include no chest pain, no abdominal pain and no shortness of breath.      Past Medical History:  Diagnosis Date   Allergy    Anxiety    Asthma    Atrial fibrillation (HCC)    Cellulitis of right leg    Chronic low back pain    Depression    GERD (gastroesophageal reflux disease)    Gout    Gout    Hyperlipidemia    Hypertension    Hypertension    Ischemic cerebrovascular accident (CVA) (HCC)    OSA (obstructive sleep apnea)    Osteopenia    Spinal stenosis    Tremor    Vitamin D deficiency     Patient Active Problem List   Diagnosis Date Noted   Acute on chronic diastolic  CHF (congestive heart failure) (HCC) 02/25/2020   Morbid obesity (HCC) 10/29/2013   LAP-BAND surgery status 10/29/2013    Past Surgical History:  Procedure Laterality Date   COLOSTOMY     LAPAROSCOPIC CHOLECYSTECTOMY     LAPAROSCOPIC GASTRIC BANDING     UPPER GI ENDOSCOPY       OB History   No obstetric history on file.     Family History  Problem Relation Age of Onset   Heart disease Mother    Heart disease Father     Social History   Tobacco Use   Smoking status: Former    Types: Cigarettes    Quit date: 10/29/1976    Years since quitting: 44.0   Smokeless tobacco: Never  Substance Use Topics   Alcohol use: No   Drug use: No    Home Medications Prior to Admission medications   Medication Sig Start Date End Date Taking? Authorizing Provider  albuterol (PROVENTIL) (2.5 MG/3ML) 0.083% nebulizer solution Inhale into the lungs.    [provider]  albuterol (VENTOLIN HFA) 108 (90 Base) MCG/ACT inhaler Inhale into the lungs. 10/21/19   [provider]  ALPRAZolam Prudy Feeler(XANAX) 0.25 MG tablet Take by mouth. 05/11/20   [provider]  apixaban (ELIQUIS) 5 MG TABS tablet TAKE 1 TABLET(5 MG)  BY MOUTH TWICE DAILY 06/02/20   [provider]  atorvastatin (LIPITOR) 10 MG tablet Take by mouth. 07/12/20   [provider]  colchicine 0.6 MG tablet Take 1 tablet (0.6 mg total) by mouth daily. 01/11/16   Mesner, Barbara Cower, MD  diltiazem (TIAZAC) 240 MG 24 hr capsule Take 240 mg by mouth daily. 07/12/20   [provider]  gabapentin (NEURONTIN) 100 MG capsule Take by mouth.    [provider]  metoprolol tartrate (LOPRESSOR) 25 MG tablet Take 25 mg by mouth 2 (two) times daily.    [provider]  Nebulizers MISC Use every 4-6 hours as needed 01/14/20   [provider]  Vitamin D, Ergocalciferol, (DRISDOL) 1.25 MG (50000 UNIT) CAPS capsule Take 1 capsule by mouth 2 (two) times a week. 02/18/20   [provider]     Allergies    Codeine, Ibuprofen, and Sulfa antibiotics  Review of Systems   Review of Systems  Constitutional:  Negative for chills and fever.  HENT: Negative.    Respiratory:  Negative for cough and shortness of breath.   Cardiovascular:  Negative for chest pain.  Gastrointestinal:  Negative for abdominal pain, nausea and vomiting.  Genitourinary:  Negative for dysuria and frequency.  Musculoskeletal:  Positive for arthralgias and joint swelling.  Skin:  Positive for color change and wound.  Neurological:  Negative for dizziness, weakness and light-headedness.  All other systems reviewed and are negative.  Physical Exam Updated Vital Signs BP (!) 104/58   Pulse 82   Temp 98.1 F (36.7 C) (Oral)   Resp 16   Ht 5\' 2"  (1.575 m)   Wt 103.4 kg   SpO2 99%   BMI 41.70 kg/m   Physical Exam Vitals and nursing note reviewed.  Constitutional:      General: She is not in acute distress.    Appearance: Normal appearance. She is well-developed. She is obese. She is not ill-appearing or diaphoretic.  HENT:     Head: Normocephalic and atraumatic.  Eyes:     General:        Right eye: No discharge.        Left eye: No discharge.  Cardiovascular:     Rate and Rhythm: Normal rate and regular rhythm.     Pulses: Normal pulses.     Heart sounds: Normal heart sounds.  Pulmonary:     Effort: Pulmonary effort is normal. No respiratory distress.     Breath sounds: Normal breath sounds. No wheezing or rales.     Comments: Respirations equal and unlabored, patient able to speak in full sentences, lungs clear to auscultation bilaterally  Abdominal:     General: Bowel sounds are normal. There is no distension.     Palpations: Abdomen is soft. There is no mass.     Tenderness: There is no abdominal tenderness. There is no guarding.     Comments: Abdomen soft, nondistended, nontender to palpation in all quadrants without guarding or peritoneal signs  Musculoskeletal:        General:  Swelling, tenderness and deformity present.     Cervical back: Neck supple.     Comments: Right second toe with erythema, swelling and purulent drainage, exquisitely tender to palpation and pain with any movement or weightbearing there is also erythema and warmth extending up the foot.  Significant edema of bilateral lower extremities.  DP pulses confirmed with Doppler.  Skin:    General: Skin is warm and dry.  Capillary Refill: Capillary refill takes less than 2 seconds.  Neurological:     Mental Status: She is alert and oriented to person, place, and time.     Coordination: Coordination normal.     Comments: Speech is clear, able to follow commands Moves extremities without ataxia, coordination intact  Psychiatric:        Mood and Affect: Mood normal.        Behavior: Behavior normal.     ED Results / Procedures / Treatments   Labs (all labs ordered are listed, but only abnormal results are displayed) Labs Reviewed  COMPREHENSIVE METABOLIC PANEL - Abnormal; Notable for the following components:      Result Value   Potassium 3.3 (*)    All other components within normal limits  CBC WITH DIFFERENTIAL/PLATELET - Abnormal; Notable for the following components:   RBC 5.43 (*)    MCV 78.8 (*)    MCH 23.8 (*)    RDW 15.8 (*)    All other components within normal limits  RESP PANEL BY RT-PCR (FLU A&B, COVID) ARPGX2  CULTURE, BLOOD (ROUTINE X 2)  CULTURE, BLOOD (ROUTINE X 2)  LACTIC ACID, PLASMA    EKG None  Radiology DG Foot Complete Right  Result Date: 10/23/2020 CLINICAL DATA:  Question osteo necrosis. Drainage from the right second toe. EXAM: RIGHT FOOT COMPLETE - 3+ VIEW COMPARISON:  None. FINDINGS: Diffuse osteopenia is present. Soft tissue density is noted in the distal second digit. The digit is enlarged. No definite osseous destruction is present. Joints are located. No gas is evident in the soft tissues. Soft tissue swelling is noted about the ankle. Advanced  degenerative changes are evident at the ankle. IMPRESSION: 1. No definite osteomyelitis. 2. Soft tissue swelling in the distal second digit. 3. No gas in the soft tissues. 4. Diffuse osteopenia. 5. Advanced degenerative changes at the ankle. Electronically Signed   By: Marin Roberts M.D.   On: 10/23/2020 15:13    Procedures Procedures   Medications Ordered in ED Medications  0.9 %  sodium chloride infusion ( Intravenous Stopped 10/23/20 1915)  sodium chloride 0.9 % bolus 500 mL ( Intravenous Stopped 10/23/20 1802)  ondansetron (ZOFRAN) injection 4 mg (4 mg Intravenous Given 10/23/20 1633)  fentaNYL (SUBLIMAZE) injection 50 mcg (50 mcg Intravenous Given 10/23/20 1633)  piperacillin-tazobactam (ZOSYN) IVPB 3.375 g (0 g Intravenous Stopped 10/23/20 1811)  vancomycin (VANCOCIN) IVPB 1000 mg/200 mL premix (0 mg Intravenous Stopped 10/23/20 1919)    ED Course  I have reviewed the triage vital signs and the nursing notes.  Pertinent labs & imaging results that were available during my care of the patient were reviewed by me and considered in my medical decision making (see chart for details).    MDM Rules/Calculators/A&P                         75 year old female presents with redness, swelling and purulent drainage from the right second toe, had thought she was having a gout flare, no prior history of wound infections.  Has been fatigued but has not had fevers or other systemic symptoms.  No history of diabetes but does have history of peripheral vascular disease, chronic edema of bilateral lower extremities, pulses confirmed with Doppler.  Will get labs and x-ray to evaluate for osteomyelitis.  There is already erythema and swelling tracking up the foot, concern the patient will likely need admission and IV antibiotics.  Patient started on IV  fluids, pain medication and vancomycin and Zosyn.  I have independently ordered, reviewed and interpreted all labs and imaging: CBC: No leukocytosis,  normal hemoglobin CMP: Mild hypokalemia, no other electrolyte derangements, normal renal and liver function Lactic acid: WNL Blood cultures: Pending COVID/flu: Negative  X-ray shows no definitive osteomyelitis, soft tissue swelling in the distal second digit, but no gas noted in the soft tissues.  X-ray is reassuring but still feel patient will need admission for severe wound infection with purulent drainage.  Given peripheral vascular disease feel patient is at advanced risk for poor wound healing and worsening infection.   Patient's family would prefer that the patient be admitted at [facility] if possible, but if no beds available they are in agreement with being admitted to [facility].  Will consult [facility] transfer line.  [facility] reports that they will have beds available later this evening, case discussed with Dr. Almeta Monas who accepts patient for transfer and admission for cellulitis and infection of the right foot.  Patient may need podiatry or orthopedic consult if infection not improving with antibiotics.  Final Clinical Impression(s) / ED Diagnoses Final diagnoses:  Cellulitis of toe of right foot    Rx / DC Orders ED Discharge Orders     None        Legrand Rams 10/23/20 2053    Charlynne Pander, MD 10/24/20 [phone]

## 2020-10-23 NOTE — ED Notes (Signed)
Provider made aware of O2 sats and BP (kelsey, pa)

## 2020-10-23 NOTE — ED Triage Notes (Signed)
Right foot gout exacerbation started Thursday. Today 2nd toe has thick white purulent drainage along with thin serosanguianous weeping.  Erythema and edema across dorsal foot and base of great toe.  In wheelchair; too painful to walk.

## 2020-10-23 NOTE — ED Notes (Signed)
Spoke to [place] at [facility]. Updated on pts COVID results. Annette Moore stated that he would call his transport team and someone would call with an ETA for transport shortly. Charge and provider updated

## 2020-10-23 NOTE — ED Notes (Signed)
ED Provider at bedside. Jodi Geralds, [place]

## 2020-10-23 NOTE — ED Notes (Signed)
ED Provider at bedside. 

## 2020-10-28 LAB — CULTURE, BLOOD (ROUTINE X 2)
Culture: NO GROWTH
Culture: NO GROWTH
Special Requests: ADEQUATE
Special Requests: ADEQUATE

## 2020-11-08 HISTORY — PX: THORACENTESIS: SHX235

## 2020-12-31 ENCOUNTER — Ambulatory Visit: Payer: Medicare PPO | Admitting: Adult Health

## 2021-01-04 ENCOUNTER — Other Ambulatory Visit: Payer: Self-pay

## 2021-01-04 ENCOUNTER — Ambulatory Visit: Payer: Medicare PPO | Admitting: Primary Care

## 2021-01-04 ENCOUNTER — Encounter: Payer: Self-pay | Admitting: Primary Care

## 2021-01-04 ENCOUNTER — Ambulatory Visit (INDEPENDENT_AMBULATORY_CARE_PROVIDER_SITE_OTHER): Payer: Medicare PPO

## 2021-01-04 VITALS — BP 110/70 | HR 64 | Temp 98.1°F | Ht 62.0 in | Wt 228.0 lb

## 2021-01-04 DIAGNOSIS — Z8669 Personal history of other diseases of the nervous system and sense organs: Secondary | ICD-10-CM | POA: Diagnosis not present

## 2021-01-04 DIAGNOSIS — J90 Pleural effusion, not elsewhere classified: Secondary | ICD-10-CM | POA: Diagnosis not present

## 2021-01-04 DIAGNOSIS — J45909 Unspecified asthma, uncomplicated: Secondary | ICD-10-CM | POA: Diagnosis not present

## 2021-01-04 DIAGNOSIS — I5033 Acute on chronic diastolic (congestive) heart failure: Secondary | ICD-10-CM | POA: Diagnosis not present

## 2021-01-04 NOTE — Assessment & Plan Note (Signed)
-   Needs home sleep study scheduled, this was ordered back in April 2022 but not completed

## 2021-01-04 NOTE — Progress Notes (Signed)
@Patient  ID: , female    DOB: 1945-05-12, 75 y.o.   MRN: [id number]  Chief Complaint  Patient presents with   Follow-up    Patient reports no concerns today.     Referring provider: [id number], FNP  HPI: 75 year old female, former smoker quit 1978.  Past medical history significant for acute on chronic diastolic heart failure, history of asthma, right pleural effusion, sleep apnea, Lap-Band surgery, morbid obesity.  Patient of Dr. 1979, seen for initial consult on 07/27/2020. Ordered for CXR, pulmonary function testing and home sleep study.   01/04/2021- Interim hx  Patient presents today for an overdue follow-up.  Chest x-ray in April showed persistent right greater than left effusions.  Underwent thoracentesis of the lung on 08/04/2020. No growth on fluid culture, cytology showed reactive mesothelial cells. PFTs and home sleep test have not been completed. She subsequently cancelled/ no-showed for several appointment after this procedure. She underwent a second thoracentesis in late August/September at outside facility. She saw Dr. 08/06/2020 with [facility] cardiology on 12/10/20. Recurrent pleural effusion not felt to be coming from cardiac cause. Possibly related to pulmonary HTN. She has severe pulmonary hypertension likely from untreated sleep apnea, afib or HTN. Cardiology did not recommend heart catheterizations at this point. She is is following low salt diet ad taking lasix 40mg  daily. She is on Eliquis 5mg  twice daily for hx afib. She is doing alright today, no acute complaints. She has intermittent shortness of breath symptoms. She needed to use her Albuterol rescue inhaler yesterday.   Allergies  Allergen Reactions   Codeine Hives    Itching   Sulfa Antibiotics Hives   Ibuprofen Other (See Comments)    States that her doctor told her not to take it because of her heart.    Immunization History  Administered Date(s) Administered   Influenza, High Dose Seasonal PF  01/19/2016, 03/16/2017, 05/17/2018, 01/02/2019, 12/09/2019, 12/21/2020   Influenza-Unspecified 01/19/2016   Pneumococcal Conjugate-13 01/28/2014   Pneumococcal Polysaccharide-23 04/07/2008, 03/16/2017   Tdap 11/20/2008, 09/07/2019    Past Medical History:  Diagnosis Date   Allergy    Anxiety    Asthma    Atrial fibrillation (HCC)    Cellulitis of right leg    Chronic low back pain    Depression    GERD (gastroesophageal reflux disease)    Gout    Gout    Hyperlipidemia    Hypertension    Hypertension    Ischemic cerebrovascular accident (CVA) (HCC)    OSA (obstructive sleep apnea)    Osteopenia    Spinal stenosis    Tremor    Vitamin D deficiency     Tobacco History: Social History   Tobacco Use  Smoking Status Former   Types: Cigarettes   Quit date: 10/29/1976   Years since quitting: 44.2  Smokeless Tobacco Never   Counseling given: Not Answered   Outpatient Medications Prior to Visit  Medication Sig Dispense Refill   albuterol (PROVENTIL) (2.5 MG/3ML) 0.083% nebulizer solution Inhale into the lungs.     albuterol (VENTOLIN HFA) 108 (90 Base) MCG/ACT inhaler Inhale into the lungs.     allopurinol (ZYLOPRIM) 100 MG tablet Take 1 tablet by mouth daily.     ALPRAZolam (XANAX) 0.25 MG tablet Take by mouth.     apixaban (ELIQUIS) 5 MG TABS tablet TAKE 1 TABLET(5 MG) BY MOUTH TWICE DAILY     atorvastatin (LIPITOR) 10 MG tablet Take by mouth.     benzonatate (TESSALON) 100  MG capsule Take by mouth.     colchicine 0.6 MG tablet Take 1 tablet (0.6 mg total) by mouth daily. 30 tablet 0   diltiazem (TIAZAC) 240 MG 24 hr capsule Take 240 mg by mouth daily.     furosemide (LASIX) 40 MG tablet Take 40 mg by mouth.     gabapentin (NEURONTIN) 100 MG capsule Take by mouth.     magnesium oxide (MAG-OX) 400 MG tablet Take 1 tablet by mouth 2 (two) times daily.     metoprolol tartrate (LOPRESSOR) 25 MG tablet Take 25 mg by mouth 2 (two) times daily.     Nebulizers MISC Use every  4-6 hours as needed     potassium chloride (KLOR-CON) 10 MEQ tablet Take 10 mEq by mouth daily.     Vitamin D, Ergocalciferol, (DRISDOL) 1.25 MG (50000 UNIT) CAPS capsule Take 1 capsule by mouth 2 (two) times a week.     No facility-administered medications prior to visit.    Review of Systems  Review of Systems  Constitutional: Negative.   Respiratory:  Positive for shortness of breath. Negative for cough, chest tightness and wheezing.   Cardiovascular: Negative.     Physical Exam  BP 110/70 (BP Location: Left Arm, Patient Position: Sitting, Cuff Size: Large)   Pulse 64   Temp 98.1 F (36.7 C) (Oral)   Ht 5\' 2"  (1.575 m)   Wt 228 lb (103.4 kg) Comment: per patient  SpO2 97%   BMI 41.70 kg/m  Physical Exam Constitutional:      Appearance: Normal appearance.  Cardiovascular:     Rate and Rhythm: Normal rate and regular rhythm.  Pulmonary:     Effort: Pulmonary effort is normal.     Breath sounds: No rhonchi or rales.     Comments: Poor effort; upper airway wheeze  Musculoskeletal:        General: Normal range of motion.  Neurological:     General: No focal deficit present.     Mental Status: She is alert and oriented to person, place, and time. Mental status is at baseline.  Psychiatric:        Mood and Affect: Mood normal.        Behavior: Behavior normal.        Thought Content: Thought content normal.        Judgment: Judgment normal.     Lab Results:  CBC    Component Value Date/Time   WBC 6.8 10/23/2020 1525   RBC 5.43 (H) 10/23/2020 1525   HGB 12.9 10/23/2020 1525   HCT 42.8 10/23/2020 1525   PLT 243 10/23/2020 1525   MCV 78.8 (L) 10/23/2020 1525   MCH 23.8 (L) 10/23/2020 1525   MCHC 30.1 10/23/2020 1525   RDW 15.8 (H) 10/23/2020 1525   LYMPHSABS 1.7 10/23/2020 1525   MONOABS 1.0 10/23/2020 1525   EOSABS 0.3 10/23/2020 1525   BASOSABS 0.0 10/23/2020 1525    BMET    Component Value Date/Time   NA 138 10/23/2020 1525   K 3.3 (L) 10/23/2020  1525   CL 101 10/23/2020 1525   CO2 28 10/23/2020 1525   GLUCOSE 86 10/23/2020 1525   BUN 10 10/23/2020 1525   CREATININE 0.88 10/23/2020 1525   CALCIUM 9.3 10/23/2020 1525   GFRNONAA >60 10/23/2020 1525   GFRAA >60 02/11/2016 1905    BNP    Component Value Date/Time   BNP 177.0 (H) 02/25/2020 1952    ProBNP No results found for: PROBNP  Imaging: DG Chest 2 View  Result Date: 01/04/2021 CLINICAL DATA:  Recurrent right pleural effusion. EXAM: CHEST - 2 VIEW COMPARISON:  08/04/2020 FINDINGS: Stable cardiac enlargement. There is a moderate right pleural effusion which is increased in volume compared with the previous exam. Overlying platelike atelectasis noted. Pulmonary vascular congestion without frank edema. Osseous structures are unremarkable. IMPRESSION: 1. Increase in volume of right pleural effusion. 2. Cardiac enlargement and pulmonary vascular congestion. Electronically Signed   By: Signa Kell M.D.   On: 01/04/2021 12:37     Assessment & Plan:   Recurrent right pleural effusion - Chest x-ray in April 2022 showed persistent right greater than left effusions.  Underwent thoracentesis of the lung on 08/04/2020. No growth on fluid culture, cytology showed reactive mesothelial cells. She underwent a second thoracentesis in late August/September at outside facility. [facility] cardiology did not feel pleural effusion was coming from cardiac cause,  possbily related to pulmonary HTN. Needs repeat CXR today  Acute on chronic diastolic CHF (congestive heart failure) (HCC) - Maintained on lasix 40mg  daily   Hx of sleep apnea - Needs home sleep study scheduled, this was ordered back in April 2022 but not completed  Asthma - Patient has hx asthma, needs PFTs to assess for obstructive airway disease  - Continue PRN albuterol 2 puffs every 6 hours as needed for breakthrough shortness of breath/wheezing    May 2022, NP 01/04/2021

## 2021-01-04 NOTE — Assessment & Plan Note (Signed)
-   Chest x-ray in April 2022 showed persistent right greater than left effusions.  Underwent thoracentesis of the lung on 08/04/2020. No growth on fluid culture, cytology showed reactive mesothelial cells. She underwent a second thoracentesis in late August/September at outside facility. [facility] cardiology did not feel pleural effusion was coming from cardiac cause,  possbily related to pulmonary HTN. Needs repeat CXR today

## 2021-01-04 NOTE — Progress Notes (Signed)
Reviewed and agree with assessment/plan.   Coralyn Helling, MD [facility] [place] Pulmonary/Critical Care 01/04/2021, 1:27 PM Pager:  [phone]

## 2021-01-04 NOTE — Progress Notes (Signed)
Please let patient know CXR showed moderate right sided pleural effusion. Discussed with Dr. Craige Cotta, recommended holding off on repeat thoracentesis. Continue Lasix, low sodium diet and we will follow up after sleep study

## 2021-01-04 NOTE — Assessment & Plan Note (Addendum)
-   Maintained on lasix 40mg  daily

## 2021-01-04 NOTE — Assessment & Plan Note (Signed)
-   Patient has hx asthma, needs PFTs to assess for obstructive airway disease  - Continue PRN albuterol 2 puffs every 6 hours as needed for breakthrough shortness of breath/wheezing

## 2021-01-04 NOTE — Patient Instructions (Addendum)
Recommendations: Continue to use albuterol 2 puffs every 4-6 hour as needed for shortness of breath Continue lasix 40mg  daily as directed by cardiology   Orders: CXR today re: recurrent pleural effusion (ordered) Needs to scheduled pulmonary function testing and home sleep study (already ordered)  Follow-up: November with Dr. December with 1 hour PFT prior

## 2021-01-05 ENCOUNTER — Telehealth: Payer: Self-pay | Admitting: Primary Care

## 2021-01-05 NOTE — Telephone Encounter (Signed)
Glenford Bayley, NP  01/04/2021  2:22 PM EDT Back to Top    Please let patient know CXR showed moderate right sided pleural effusion. Discussed with Dr. Craige Cotta, recommended holding off on repeat thoracentesis. Continue Lasix, low sodium diet and we will follow up after sleep study      Called pt and spoke with daughter Gilmore Laroche about the results of pt's recent cxr. Gilmore Laroche said that pt is scheduled to have foot surgery soon and wants to make sure nothing else needed to be done prior to the surgery. Also wants to make sure that pt would be okay to still have the surgery. Asked Gilmore Laroche if the surgery was mentioned to [place] during [place] said that pt did mention it to her at the OV.  Beth, please advise on this. Thanks!

## 2021-01-05 NOTE — Telephone Encounter (Signed)
Called patient but she did not answer. Left message for her to call back.  

## 2021-01-05 NOTE — Telephone Encounter (Signed)
I apologize but do not recall her mentioning upcoming foot surgery, regardless she will be considered moderate-high risk for surgery. I would prefer she have PFT and HST done before surgery.

## 2021-01-08 HISTORY — PX: OTHER SURGICAL HISTORY: SHX169

## 2021-01-17 NOTE — Telephone Encounter (Signed)
Pt did have surgery on right foot 01/07/21. 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

## 2021-02-02 ENCOUNTER — Telehealth: Payer: Self-pay | Admitting: Pulmonary Disease

## 2021-02-03 NOTE — Telephone Encounter (Signed)
I called the daughter and she reports that her mom was feeling some shortness of breath on 2 Liters but she was not going below 88%. The daughter is going to keep an eye on it and if she gets below 88% she will let the provider know and she will have her mom make sure she is breathing in through her nose and out through her mouth and if she wants to increase to 3 liters for a few minutes to help she can. Nothing further needed.

## 2021-02-09 ENCOUNTER — Other Ambulatory Visit: Payer: Self-pay

## 2021-02-09 ENCOUNTER — Ambulatory Visit (INDEPENDENT_AMBULATORY_CARE_PROVIDER_SITE_OTHER): Payer: Medicare PPO

## 2021-02-09 ENCOUNTER — Ambulatory Visit (INDEPENDENT_AMBULATORY_CARE_PROVIDER_SITE_OTHER): Payer: Medicare PPO | Admitting: Pulmonary Disease

## 2021-02-09 ENCOUNTER — Encounter: Payer: Self-pay | Admitting: Pulmonary Disease

## 2021-02-09 VITALS — BP 112/74 | HR 63 | Wt 225.0 lb

## 2021-02-09 DIAGNOSIS — J90 Pleural effusion, not elsewhere classified: Secondary | ICD-10-CM

## 2021-02-09 DIAGNOSIS — R0683 Snoring: Secondary | ICD-10-CM

## 2021-02-09 NOTE — Progress Notes (Signed)
[facility]  Chief Complaint  Patient presents with   DOE    Constitutional:  BP 112/74   Pulse 63   Wt 225 lb (102.1 kg)   SpO2 97%   BMI 41.15 kg/m   Past Medical History:  GERD, Anxiety, DM type 2, Permanent atrial fibrillation, Ischemic CVA, Asthma, HLD, HTN, s/p gastric band, OSA, Allergic rhinitis, Chronic back pain with spinal stenosis, Osteopenia, Depression, Vit D deficiency, Gout, Rt leg cellulitis, Tremor, Cirrhosis  Past Surgical History:  She  has a past surgical history that includes Colostomy; Laparoscopic cholecystectomy; Upper gi endoscopy; and Laparoscopic gastric banding.  Brief Summary:  Annette Moore is a 75 y.o. female former smoker with obstructive sleep apnea, dyspnea, and pulmonary hypertension.      Subjective:   She is here with her daughter.  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  She had repeat thoracentesis at [place] in August with 700 ml fluid removed.  She was sent home with supplemental oxygen.  Uses 2 liters intermittently.  Has PFT scheduled for later this month.  Wasn't able to schedule home sleep study yet.  Has cough with clear sputum and intermittent wheeze.    Chest xray from September showed recurrence of right effusion.  Physical Exam:   Appearance - well kempt, sitting in wheelchair  ENMT - no sinus tenderness, no oral exudate, no LAN, Mallampati 3 airway, no stridor  Respiratory - b/l rhonchi that clear with cough  CV - s1s2 regular rate and rhythm, no murmurs  Ext -1+ edema  Skin - no rashes  Psych - normal mood and affect    Pulmonary testing:  Rt thoracentesis 12/04/16 >> 700 ml fluid, glucose 135, LDH 327, 486 WBC (58%M, 26%L, 9%N), cytology negative Rt thoracentesis 08/04/20 >> glucose 105, protein < 3, LDH 65, cell count specimen clotted, reactive mesothelial cells  Chest Imaging:  CT angio chest 04/16/20 >> main PA 3.7 cm, moderate  Rt pleural effusion  Sleep Tests:    Cardiac Tests:  Doppler legs b/l 04/21/20 >> no DVT Echo 11/26/20 >> EF 65 to 70%, mild RV dilation, severe LA dilation, mod RA dilation, RVSP 85 mmHg  Social History:  She  reports that she quit smoking about 44 years ago. Her smoking use included cigarettes. She has never used smokeless tobacco. She reports that she does not drink alcohol and does not use drugs.  Family History:  Her family history includes Heart disease in her father and mother.     Assessment/Plan:   Obstructive sleep apnea. - discussed how sleep apnea can impact her health - will reschedule home sleep study  History of asthma and tobacco abuse. - she has PFT scheduled for 03/09/21 - continue prn albuterol pending PFT results  Recurrent right pleural effusion. - previous fluid analysis consistent with transudate - repeat chest xray today  Chronic hypoxic respiratory failure. - SpO2 > 92% at rest on room air today - advised her to continue 2 liters oxygen with exertion and sleep for now until additional testing completed   Chronic diastolic CHF, permanent atrial fibrillation, pulmonary hypertension. - followed by Dr. Bishop Limbo with cardiology at [facility]  Dysphagia, GERD, cirrhosis. - followed by Dr. Maretta Los with gastroenterology at [facility]  Time Spent Involved in Patient Care on Day of Examination:  33 minutes  Follow up:   Patient Instructions  Chest xray today  Pulmonary function test scheduled for 03/09/21  Will arrange for home sleep study  Follow up in 3 months  Medication List:   Allergies as of 02/09/2021       Reactions   Codeine Hives   Itching   Sulfa Antibiotics Hives   Ibuprofen Other (See Comments)   States that her doctor told her not to take it because of her heart.        Medication List        Accurate as of February 09, 2021 12:43 PM. If you have any questions, ask your nurse or doctor.          acetaminophen 325 MG  tablet Commonly known as: TYLENOL Take by mouth.   albuterol (2.5 MG/3ML) 0.083% nebulizer solution Commonly known as: PROVENTIL Inhale into the lungs.   albuterol 108 (90 Base) MCG/ACT inhaler Commonly known as: VENTOLIN HFA Inhale into the lungs.   allopurinol 100 MG tablet Commonly known as: ZYLOPRIM Take 1 tablet by mouth daily.   ALPRAZolam 0.25 MG tablet Commonly known as: XANAX Take by mouth.   atorvastatin 10 MG tablet Commonly known as: LIPITOR Take by mouth.   benzonatate 100 MG capsule Commonly known as: TESSALON Take by mouth.   colchicine 0.6 MG tablet Take 1 tablet (0.6 mg total) by mouth daily.   diltiazem 240 MG 24 hr capsule Commonly known as: TIAZAC Take 240 mg by mouth daily.   DULoxetine 30 MG capsule Commonly known as: CYMBALTA Take 1 capsule by mouth daily.   Eliquis 5 MG Tabs tablet Generic drug: apixaban TAKE 1 TABLET(5 MG) BY MOUTH TWICE DAILY   famotidine 20 MG tablet Commonly known as: PEPCID Take by mouth.   gabapentin 100 MG capsule Commonly known as: NEURONTIN Take by mouth.   magnesium oxide 400 MG tablet Commonly known as: MAG-OX Take 1 tablet by mouth 2 (two) times daily.   metoprolol tartrate 25 MG tablet Commonly known as: LOPRESSOR Take 25 mg by mouth 2 (two) times daily.   Nebulizers Misc Use every 4-6 hours as needed   potassium chloride 10 MEQ tablet Commonly known as: KLOR-CON Take 10 mEq by mouth daily.   Vitamin D (Ergocalciferol) 1.25 MG (50000 UNIT) Caps capsule Commonly known as: DRISDOL Take 1 capsule by mouth 2 (two) times a week.        Signature:  Coralyn Helling, MD [facility] Pulmonary/Critical Care Pager - [phone] 02/09/2021, 12:43 PM

## 2021-02-09 NOTE — Patient Instructions (Addendum)
Chest xray today  Pulmonary function test scheduled for 03/09/21  Will arrange for home sleep study  Follow up in 3 months

## 2021-02-16 ENCOUNTER — Telehealth: Payer: Self-pay | Admitting: Pulmonary Disease

## 2021-02-16 DIAGNOSIS — J90 Pleural effusion, not elsewhere classified: Secondary | ICD-10-CM

## 2021-02-16 DIAGNOSIS — K7460 Unspecified cirrhosis of liver: Secondary | ICD-10-CM

## 2021-02-16 NOTE — Telephone Encounter (Signed)
Chest xray from 02/10/21 shows Rt > Lt pleural effusion.     Spoke with patients daughter.  Explained she has persistent pleural effusions.  In review of cardiology note from September, didn't seem like any cardiac interventions were planned.  She does have pulmonary hypertension and daughter reports patient might have been told she has cirrhosis.  Will arrange for abdominal ultrasound to assess this further.

## 2021-02-28 ENCOUNTER — Ambulatory Visit (HOSPITAL_BASED_OUTPATIENT_CLINIC_OR_DEPARTMENT_OTHER): Payer: Medicare PPO

## 2021-03-08 ENCOUNTER — Ambulatory Visit (HOSPITAL_BASED_OUTPATIENT_CLINIC_OR_DEPARTMENT_OTHER): Payer: Medicare PPO

## 2021-03-09 ENCOUNTER — Ambulatory Visit: Payer: Medicare PPO | Admitting: Pulmonary Disease

## 2021-04-12 ENCOUNTER — Ambulatory Visit: Payer: Medicare PPO | Admitting: Primary Care

## 2021-04-12 ENCOUNTER — Other Ambulatory Visit: Payer: Self-pay

## 2021-04-12 NOTE — Progress Notes (Deleted)
@Patient  ID: Annette Moore, female    DOB: 1945/10/18, 76 y.o.   MRN: [id number]  No chief complaint on file.   Referring provider: Deborah Chalk, FNP  HPI: 76 year old female, former smoker quit 1978.  Past medical history significant for acute on chronic diastolic heart failure, history of asthma, right pleural effusion, sleep apnea, Lap-Band surgery, morbid obesity.  Patient of Dr. Halford Chessman, seen for initial consult on 07/27/2020. Ordered for CXR, pulmonary function testing and home sleep study.    Previous [facility] pulmonary encounter: 01/04/2021- Interim hx  Patient presents today for an overdue follow-up.  Chest x-ray in April showed persistent right greater than left effusions.  Underwent thoracentesis of the lung on 08/04/2020. No growth on fluid culture, cytology showed reactive mesothelial cells. PFTs and home sleep test have not been completed. She subsequently cancelled/ no-showed for several appointment after this procedure. She underwent a second thoracentesis in late August/September at outside facility. She saw Dr. Quillian Quince with [place] cardiology on 12/10/20. Recurrent pleural effusion not felt to be coming from cardiac cause. Possibly related to pulmonary HTN. She has severe pulmonary hypertension likely from untreated sleep apnea, afib or HTN. Cardiology did not recommend heart catheterizations at this point. She is is following low salt diet ad taking lasix 40mg  daily. She is on Eliquis 5mg  twice daily for hx afib. She is doing alright today, no acute complaints. She has intermittent shortness of breath symptoms. She needed to use her Albuterol rescue inhaler yesterday.  02/09/21- Dr. Halford Chessman  She is here with her daughter.  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  She had repeat thoracentesis at [place] in August with 700 ml fluid removed.  She was sent home with supplemental oxygen.  Uses 2 liters intermittently.  Has PFT scheduled for later this month.  Wasn't  able to schedule home sleep study yet.  Has cough with clear sputum and intermittent wheeze.    Chest xray from September showed recurrence of right effusion.  Obstructive sleep apnea. - discussed how sleep apnea can impact her health - will reschedule home sleep study   History of asthma and tobacco abuse. - she has PFT scheduled for 03/09/21 - continue prn albuterol pending PFT results   Recurrent right pleural effusion. - previous fluid analysis consistent with transudate - repeat chest xray today   Chronic hypoxic respiratory failure. - SpO2 > 92% at rest on room air today - advised her to continue 2 liters oxygen with exertion and sleep for now until additional testing completed    Chronic diastolic CHF, permanent atrial fibrillation, pulmonary hypertension. - followed by Dr. Bishop Limbo with [facility]ology at [facility]   Dysphagia, GERD, cirrhosis. - followed by Dr. Maretta Los with gastroenterology at [facility]   04/12/2021- Interim hx  Patient presents today for 2 month follow-up with PFTs.      Patient had a CXR in November 2022 that showed right?left pleural effusion. She has persistent pleural effusion, no cardiac interventions were planned. She has pulmonary HTN and possible cirrhosis. She was ordered for abdominal ultrasound to assess further but this was never completed.      Pulmonary testing: Rt thoracentesis 12/04/16 >> 700 ml fluid, glucose 135, LDH 327, 486 WBC (58%M, 26%L, 9%N), cytology negative Rt thoracentesis 08/04/20 >> glucose 105, protein < 3, LDH 65, cell count specimen clotted, reactive mesothelial cells   Chest imaging: CT angio chest 04/16/20 >> main PA 3.7 cm, moderate Rt pleural effusion  Cardiac  testing: Doppler legs b/l 04/21/20 >> no DVT Echo 11/26/20 >> EF 65 to 70%, mild RV dilation, severe LA dilation, mod RA dilation, RVSP 85 mmHg  Allergies  Allergen Reactions   Codeine Hives    Itching   Sulfa Antibiotics Hives   Ibuprofen Other (See  Comments)    States that her doctor told her not to take it because of her heart.    Immunization History  Administered Date(s) Administered   Influenza, High Dose Seasonal PF 01/19/2016, 03/16/2017, 05/17/2018, 01/02/2019, 12/09/2019, 12/21/2020   Influenza-Unspecified 01/19/2016   Pneumococcal Conjugate-13 01/28/2014   Pneumococcal Polysaccharide-23 04/07/2008, 03/16/2017   Tdap 11/20/2008, 09/07/2019    Past Medical History:  Diagnosis Date   Allergy    Anxiety    Asthma    Atrial fibrillation (HCC)    Cellulitis of right leg    Chronic low back pain    Depression    GERD (gastroesophageal reflux disease)    Gout    Gout    Hyperlipidemia    Hypertension    Hypertension    Ischemic cerebrovascular accident (CVA) ([place])    OSA (obstructive sleep apnea)    Osteopenia    Spinal stenosis    Tremor    Vitamin D deficiency     Tobacco History: Social History   Tobacco Use  Smoking Status Former   Types: Cigarettes   Quit date: 10/29/1976   Years since quitting: 44.4  Smokeless Tobacco Never   Counseling given: Not Answered   Outpatient Medications Prior to Visit  Medication Sig Dispense Refill   acetaminophen (TYLENOL) 325 MG tablet Take by mouth.     albuterol (PROVENTIL) (2.5 MG/3ML) 0.083% nebulizer solution Inhale into the lungs.     albuterol (VENTOLIN HFA) 108 (90 Base) MCG/ACT inhaler Inhale into the lungs.     allopurinol (ZYLOPRIM) 100 MG tablet Take 1 tablet by mouth daily.     ALPRAZolam (XANAX) 0.25 MG tablet Take by mouth.     apixaban (ELIQUIS) 5 MG TABS tablet TAKE 1 TABLET(5 MG) BY MOUTH TWICE DAILY     atorvastatin (LIPITOR) 10 MG tablet Take by mouth.     benzonatate (TESSALON) 100 MG capsule Take by mouth.     colchicine 0.6 MG tablet Take 1 tablet (0.6 mg total) by mouth daily. 30 tablet 0   diltiazem (TIAZAC) 240 MG 24 hr capsule Take 240 mg by mouth daily.     DULoxetine (CYMBALTA) 30 MG capsule Take 1 capsule by mouth daily.      famotidine (PEPCID) 20 MG tablet Take by mouth.     gabapentin (NEURONTIN) 100 MG capsule Take by mouth.     magnesium oxide (MAG-OX) 400 MG tablet Take 1 tablet by mouth 2 (two) times daily.     metoprolol tartrate (LOPRESSOR) 25 MG tablet Take 25 mg by mouth 2 (two) times daily.     Nebulizers MISC Use every 4-6 hours as needed     potassium chloride (KLOR-CON) 10 MEQ tablet Take 10 mEq by mouth daily.     Vitamin D, Ergocalciferol, (DRISDOL) 1.25 MG (50000 UNIT) CAPS capsule Take 1 capsule by mouth 2 (two) times a week.     No facility-administered medications prior to visit.      Review of Systems  Review of Systems   Physical Exam  There were no vitals taken for this visit. Physical Exam   Lab Results:  CBC    Component Value Date/Time   WBC 6.8 10/23/2020 1525  RBC 5.43 (H) 10/23/2020 1525   HGB 12.9 10/23/2020 1525   HCT 42.8 10/23/2020 1525   PLT 243 10/23/2020 1525   MCV 78.8 (L) 10/23/2020 1525   MCH 23.8 (L) 10/23/2020 1525   MCHC 30.1 10/23/2020 1525   RDW 15.8 (H) 10/23/2020 1525   LYMPHSABS 1.7 10/23/2020 1525   MONOABS 1.0 10/23/2020 1525   EOSABS 0.3 10/23/2020 1525   BASOSABS 0.0 10/23/2020 1525    BMET    Component Value Date/Time   NA 138 10/23/2020 1525   K 3.3 (L) 10/23/2020 1525   CL 101 10/23/2020 1525   CO2 28 10/23/2020 1525   GLUCOSE 86 10/23/2020 1525   BUN 10 10/23/2020 1525   CREATININE 0.88 10/23/2020 1525   CALCIUM 9.3 10/23/2020 1525   GFRNONAA >60 10/23/2020 1525   GFRAA >60 02/11/2016 1905    BNP    Component Value Date/Time   BNP 177.0 (H) 02/25/2020 1952    ProBNP No results found for: PROBNP  Imaging: No results found.   Assessment & Plan:   No problem-specific Assessment & Plan notes found for this encounter.     Martyn Ehrich, NP 04/12/2021

## 2021-04-22 ENCOUNTER — Ambulatory Visit: Payer: Medicare PPO | Admitting: Primary Care

## 2021-04-22 ENCOUNTER — Telehealth: Payer: Self-pay | Admitting: Primary Care

## 2021-04-22 ENCOUNTER — Ambulatory Visit (INDEPENDENT_AMBULATORY_CARE_PROVIDER_SITE_OTHER): Payer: Medicare PPO | Admitting: Pulmonary Disease

## 2021-04-22 ENCOUNTER — Other Ambulatory Visit: Payer: Self-pay

## 2021-04-22 ENCOUNTER — Other Ambulatory Visit: Payer: Self-pay | Admitting: Primary Care

## 2021-04-22 ENCOUNTER — Encounter: Payer: Self-pay | Admitting: Primary Care

## 2021-04-22 VITALS — BP 130/60 | HR 81 | Temp 97.9°F | Ht 62.0 in | Wt 222.6 lb

## 2021-04-22 DIAGNOSIS — J90 Pleural effusion, not elsewhere classified: Secondary | ICD-10-CM | POA: Diagnosis not present

## 2021-04-22 DIAGNOSIS — I5033 Acute on chronic diastolic (congestive) heart failure: Secondary | ICD-10-CM

## 2021-04-22 DIAGNOSIS — R0609 Other forms of dyspnea: Secondary | ICD-10-CM

## 2021-04-22 DIAGNOSIS — J45909 Unspecified asthma, uncomplicated: Secondary | ICD-10-CM | POA: Diagnosis not present

## 2021-04-22 DIAGNOSIS — I272 Pulmonary hypertension, unspecified: Secondary | ICD-10-CM

## 2021-04-22 DIAGNOSIS — K7460 Unspecified cirrhosis of liver: Secondary | ICD-10-CM

## 2021-04-22 DIAGNOSIS — Z8669 Personal history of other diseases of the nervous system and sense organs: Secondary | ICD-10-CM

## 2021-04-22 LAB — PULMONARY FUNCTION TEST
DL/VA % pred: 96 %
DL/VA: 4.04 ml/min/mmHg/L
DLCO cor % pred: 59 %
DLCO cor: 10.68 ml/min/mmHg
DLCO unc % pred: 59 %
DLCO unc: 10.68 ml/min/mmHg
FEF 25-75 Post: 1.03 L/sec
FEF 25-75 Pre: 0.98 L/sec
FEF2575-%Change-Post: 5 %
FEF2575-%Pred-Post: 71 %
FEF2575-%Pred-Pre: 67 %
FEV1-%Change-Post: 0 %
FEV1-%Pred-Post: 57 %
FEV1-%Pred-Pre: 57 %
FEV1-Post: 0.91 L
FEV1-Pre: 0.9 L
FEV1FVC-%Change-Post: 8 %
FEV1FVC-%Pred-Pre: 108 %
FEV6-%Change-Post: -5 %
FEV6-%Pred-Post: 51 %
FEV6-%Pred-Pre: 54 %
FEV6-Post: 1.01 L
FEV6-Pre: 1.06 L
FEV6FVC-%Change-Post: 1 %
FEV6FVC-%Pred-Post: 104 %
FEV6FVC-%Pred-Pre: 103 %
FVC-%Change-Post: -7 %
FVC-%Pred-Post: 49 %
FVC-%Pred-Pre: 52 %
FVC-Post: 1.01 L
FVC-Pre: 1.08 L
Post FEV1/FVC ratio: 90 %
Post FEV6/FVC ratio: 100 %
Pre FEV1/FVC ratio: 83 %
Pre FEV6/FVC Ratio: 99 %

## 2021-04-22 LAB — COMPREHENSIVE METABOLIC PANEL
ALT: 11 U/L (ref 0–35)
AST: 23 U/L (ref 0–37)
Albumin: 3.5 g/dL (ref 3.5–5.2)
Alkaline Phosphatase: 100 U/L (ref 39–117)
BUN: 6 mg/dL (ref 6–23)
CO2: 31 mEq/L (ref 19–32)
Calcium: 9.4 mg/dL (ref 8.4–10.5)
Chloride: 102 mEq/L (ref 96–112)
Creatinine, Ser: 0.77 mg/dL (ref 0.40–1.20)
GFR: 76.58 mL/min (ref >60.00)
Glucose, Bld: 82 mg/dL (ref 70–99)
Potassium: 3.3 mEq/L — ABNORMAL LOW (ref 3.5–5.1)
Sodium: 142 mEq/L (ref 135–145)
Total Bilirubin: 0.9 mg/dL (ref 0.2–1.2)
Total Protein: 6.4 g/dL (ref 6.0–8.3)

## 2021-04-22 LAB — NITRIC OXIDE: FeNO level (ppb): 23

## 2021-04-22 LAB — BRAIN NATRIURETIC PEPTIDE: Pro B Natriuretic peptide (BNP): 279 pg/mL — ABNORMAL HIGH (ref 0.0–100.0)

## 2021-04-22 MED ORDER — POTASSIUM CHLORIDE CRYS ER 10 MEQ PO TBCR: 10.0000 meq | EXTENDED_RELEASE_TABLET | Freq: Every day | ORAL | 0 refills | Status: AC

## 2021-04-22 NOTE — Assessment & Plan Note (Addendum)
-   Pulmonary function testing today showed moderate-severe restriction without BD response. FENO 23, wnl. Restriction likely d.t obesity and persistent pleural effusions. Her diffusion capacity is decreased so we will also check HRCT to rule out ILD and follow-up on pleural effusion. Asthma less likely as sole cause of dyspnea symptoms. Suspect persistent pleural effusion, diastolic heart failure and obesity main contributors. She does report temporary improvement from SABA. May consider therapeutic trial ICS/LABA on follow-up.

## 2021-04-22 NOTE — Assessment & Plan Note (Addendum)
-   Patient reports increased abdominal and leg swelling. She states that she is taking lasix 40mg  daily but this is not on her active medication list. Checking BNP

## 2021-04-22 NOTE — Assessment & Plan Note (Signed)
-   Scheduled for HST 04/29/21

## 2021-04-22 NOTE — Telephone Encounter (Signed)
I have rescheduled the U/S and scheduled her hst.  Nothing further needed.

## 2021-04-22 NOTE — Assessment & Plan Note (Signed)
-   Improved; BMI 40.7 (43.5 in April 2022)

## 2021-04-22 NOTE — Progress Notes (Signed)
BNP was elevated 279 and potassium was low 3.3, can you please verify that patient is taking lasix 40mg  daily. It is not on her active medication list but she told me she was taking a diuretic. If she is taking lasix she should increase to 60mg  daily x 5 days and needs to take additional KCL x 5 days. Advised they call cardiology for follow up CHF next week.

## 2021-04-22 NOTE — Telephone Encounter (Signed)
Needs abdominal US scheduled, ordered by DR. Sood in November. She was called yesterday about HST which will need to be scheduled as well

## 2021-04-22 NOTE — Progress Notes (Signed)
PFT done today. 

## 2021-04-22 NOTE — Telephone Encounter (Signed)
Spoke with the pt and again went over the lab results  She needed refill on potassium  She is aware to take extra tab x 5 days while on extra lasix  I refilled K so she would not be without and she is aware to have cards refill after that  Nothing further needed

## 2021-04-22 NOTE — Telephone Encounter (Signed)
Patient was scheduled and no showed for the appt

## 2021-04-22 NOTE — Assessment & Plan Note (Addendum)
-   Previous fluid analysis consistent with transudate  - Scheduled for abdominal ultrasound on 04/25/21 (originally ordered in November but cancelled by patient). Checking CMET and BNP

## 2021-04-22 NOTE — Progress Notes (Signed)
Reviewed and agree with assessment/plan.   Coralyn Helling, MD [facility] Pulmonary/Critical Care 04/22/2021, 2:33 PM Pager:  [phone]

## 2021-04-22 NOTE — Progress Notes (Signed)
@Patient  ID: Annette Moore, female    DOB: [birth year], 76 y.o.   MRN: [id number]  Chief Complaint  Patient presents with   Follow-up    PFT results    Referring provider: Deborah Chalk, FNP  HPI: 76 year old female, former smoker quit 1978.  Past medical history significant for acute on chronic diastolic heart failure, history of asthma, right pleural effusion, sleep apnea, Lap-Band surgery, morbid obesity.  Patient of Dr. Halford Chessman, seen for initial consult on 07/27/2020. Ordered for CXR, pulmonary function testing and home sleep study.    Previous [facility] pulmonary encounter: 01/04/2021- Interim hx  Patient presents today for an overdue follow-up.  Chest x-ray in April showed persistent right greater than left effusions.  Underwent thoracentesis of the lung on 08/04/2020. No growth on fluid culture, cytology showed reactive mesothelial cells. PFTs and home sleep test have not been completed. She subsequently cancelled/ no-showed for several appointment after this procedure. She underwent a second thoracentesis in late August/September at outside facility. She saw Dr. Quillian Quince with [place] [facility] on 12/10/20. Recurrent pleural effusion not felt to be coming from cardiac cause. Possibly related to pulmonary HTN. She has severe pulmonary hypertension likely from untreated sleep apnea, afib or HTN. Cardiology did not recommend heart catheterizations at this point. She is is following low salt diet ad taking lasix 40mg  daily. She is on Eliquis 5mg  twice daily for hx afib. She is doing alright today, no acute complaints. She has intermittent shortness of breath symptoms. She needed to use her Albuterol rescue inhaler yesterday.  02/09/21- Dr. Halford Chessman  She is here with her daughter.  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  She had repeat thoracentesis at [place] in August with 700 ml fluid removed.  She was sent home with supplemental oxygen.  Uses 2 liters intermittently.  Has  PFT scheduled for later this month.  Wasn't able to schedule home sleep study yet.  Has cough with clear sputum and intermittent wheeze.    Chest xray from September showed recurrence of right effusion.   04/12/2021- Interim hx  Patient presents today for 2 month follow-up with PFTs. Accompanied by her daughter.  PMH signifciant for OSA, pulmonary HTN, diastolic CHF, afib. She has symptoms of persistent dyspnea. Pulmonary function testing today showed moderate-severe restrictive lung disease without BD response. Moderate diffusion defect. Patient had a CXR in November 2022 that showed right > left pleural effusion. She has persistent pleural effusion, no cardiac interventions were planned. She has pulmonary HTN and possible cirrhosis. She was ordered for abdominal ultrasound to assess further but this was cancelled by patient and needs to be rescheduled. She was also ordered for HST which needs to be scheduled.     Allergies  Allergen Reactions   Codeine Hives    Itching   Sulfa Antibiotics Hives   Ibuprofen Other (See Comments)    States that her doctor told her not to take it because of her heart.    Immunization History  Administered Date(s) Administered   Influenza, High Dose Seasonal PF 01/19/2016, 03/16/2017, 05/17/2018, 01/02/2019, 12/09/2019, 12/21/2020   Influenza-Unspecified 01/19/2016   Pneumococcal Conjugate-13 01/28/2014   Pneumococcal Polysaccharide-23 04/07/2008, 03/16/2017   Tdap 11/20/2008, 09/07/2019    Past Medical History:  Diagnosis Date   Allergy    Anxiety    Asthma    Atrial fibrillation (HCC)    Cellulitis of right leg    Chronic low back pain    Depression  GERD (gastroesophageal reflux disease)    Gout    Gout    Hyperlipidemia    Hypertension    Hypertension    Ischemic cerebrovascular accident (CVA) (HCC)    OSA (obstructive sleep apnea)    Osteopenia    Spinal stenosis    Tremor    Vitamin D deficiency     Tobacco History: Social  History   Tobacco Use  Smoking Status Former   Types: Cigarettes   Quit date: 10/29/1976   Years since quitting: 44.5  Smokeless Tobacco Never   Counseling given: Not Answered   Outpatient Medications Prior to Visit  Medication Sig Dispense Refill   albuterol (PROVENTIL) (2.5 MG/3ML) 0.083% nebulizer solution Inhale into the lungs.     albuterol (VENTOLIN HFA) 108 (90 Base) MCG/ACT inhaler Inhale into the lungs.     allopurinol (ZYLOPRIM) 100 MG tablet Take 1 tablet by mouth daily.     ALPRAZolam (XANAX) 0.25 MG tablet Take by mouth.     apixaban (ELIQUIS) 5 MG TABS tablet TAKE 1 TABLET(5 MG) BY MOUTH TWICE DAILY     atorvastatin (LIPITOR) 10 MG tablet Take by mouth.     colchicine 0.6 MG tablet Take 1 tablet (0.6 mg total) by mouth daily. 30 tablet 0   diltiazem (TIAZAC) 240 MG 24 hr capsule Take 240 mg by mouth daily.     gabapentin (NEURONTIN) 100 MG capsule Take by mouth.     metoprolol tartrate (LOPRESSOR) 25 MG tablet Take 25 mg by mouth 2 (two) times daily.     Nebulizers MISC Use every 4-6 hours as needed     Vitamin D, Ergocalciferol, (DRISDOL) 1.25 MG (50000 UNIT) CAPS capsule Take 1 capsule by mouth 2 (two) times a week.     acetaminophen (TYLENOL) 325 MG tablet Take by mouth. (Patient not taking: Reported on 04/22/2021)     benzonatate (TESSALON) 100 MG capsule Take by mouth. (Patient not taking: Reported on 04/22/2021)     DULoxetine (CYMBALTA) 30 MG capsule Take 1 capsule by mouth daily. (Patient not taking: Reported on 04/22/2021)     famotidine (PEPCID) 20 MG tablet Take by mouth. (Patient not taking: Reported on 04/22/2021)     magnesium oxide (MAG-OX) 400 MG tablet Take 1 tablet by mouth 2 (two) times daily. (Patient not taking: Reported on 04/22/2021)     potassium chloride (KLOR-CON) 10 MEQ tablet Take 10 mEq by mouth daily. (Patient not taking: Reported on 04/22/2021)     No facility-administered medications prior to visit.      Review of Systems  Review of  Systems   Physical Exam  BP 130/60 (BP Location: Right Arm, Cuff Size: Normal)    Pulse 81    Temp 97.9 F (36.6 C) (Oral)    Ht 5\' 2"  (1.575 m)    Wt 222 lb 9.6 oz (101 kg)    SpO2 93%    BMI 40.71 kg/m  Physical Exam   Lab Results:  CBC    Component Value Date/Time   WBC 6.8 10/23/2020 1525   RBC 5.43 (H) 10/23/2020 1525   HGB 12.9 10/23/2020 1525   HCT 42.8 10/23/2020 1525   PLT 243 10/23/2020 1525   MCV 78.8 (L) 10/23/2020 1525   MCH 23.8 (L) 10/23/2020 1525   MCHC 30.1 10/23/2020 1525   RDW 15.8 (H) 10/23/2020 1525   LYMPHSABS 1.7 10/23/2020 1525   MONOABS 1.0 10/23/2020 1525   EOSABS 0.3 10/23/2020 1525   BASOSABS 0.0 10/23/2020 1525  BMET    Component Value Date/Time   NA 138 10/23/2020 1525   K 3.3 (L) 10/23/2020 1525   CL 101 10/23/2020 1525   CO2 28 10/23/2020 1525   GLUCOSE 86 10/23/2020 1525   BUN 10 10/23/2020 1525   CREATININE 0.88 10/23/2020 1525   CALCIUM 9.3 10/23/2020 1525   GFRNONAA >60 10/23/2020 1525   GFRAA >60 02/11/2016 1905    BNP    Component Value Date/Time   BNP 177.0 (H) 02/25/2020 1952    ProBNP No results found for: PROBNP  Imaging: No results found.   Assessment & Plan:   Asthma - Pulmonary function testing today showed moderate-severe restriction without BD response. FENO 23, wnl. Restriction likely d.t obesity and persistent pleural effusions. Her diffusion capacity is decreased so we will also check HRCT to rule out ILD and follow-up on pleural effusion. Asthma less likely as sole cause of dyspnea symptoms. Suspect persistent pleural effusion, diastolic heart failure and obesity main contributors. She does report temporary improvement from SABA. May consider therapeutic trial ICS/LABA on follow-up.   Recurrent right pleural effusion - Previous fluid analysis consistent with transudate  - Scheduled for abdominal ultrasound on 04/25/21 (originally ordered in November but cancelled by patient). Checking CMET and  BNP  Acute on chronic diastolic CHF (congestive heart failure) ([place]) - Patient reports increased abdominal and leg swelling. She states that she is taking lasix 40mg  daily but this is not on her active medication list. Checking BNP   Morbid obesity - Improved; BMI 40.7 (43.5 in April 2022)  Hx of sleep apnea - Scheduled for HST 04/29/21    40 mins spent on case: > 50% face to face   Martyn Ehrich, NP 04/22/2021

## 2021-04-22 NOTE — Patient Instructions (Addendum)
Pulmonary function testing showed moderate restriction in lung function, likely from weight but could also be from fluid. Not specifically asthma or COPD. No change to inhalers today. Continue as needed albuterol. We need to scheduled abdominal ultrasound to look at liver and get CT chest to re-evaluate pleural effusions   Orders: HRCT re: shortness of breath/ pleural effusion (ordered) FENO today re: asthma Labs (BNP and CMET- ordered)  Follow-up: 4-8 weeks with Dr. Craige Cotta or his first available

## 2021-04-25 ENCOUNTER — Ambulatory Visit (HOSPITAL_BASED_OUTPATIENT_CLINIC_OR_DEPARTMENT_OTHER): Payer: Medicare PPO

## 2021-04-27 ENCOUNTER — Ambulatory Visit (HOSPITAL_BASED_OUTPATIENT_CLINIC_OR_DEPARTMENT_OTHER)
Admission: RE | Admit: 2021-04-27 | Discharge: 2021-04-27 | Disposition: A | Discharge status: Home / Self Care | Payer: Medicare PPO | Source: Ambulatory Visit | Attending: Primary Care | Admitting: Primary Care

## 2021-04-27 ENCOUNTER — Other Ambulatory Visit: Payer: Self-pay

## 2021-04-27 ENCOUNTER — Ambulatory Visit (HOSPITAL_BASED_OUTPATIENT_CLINIC_OR_DEPARTMENT_OTHER)
Admission: RE | Admit: 2021-04-27 | Discharge: 2021-04-27 | Disposition: A | Discharge status: Home / Self Care | Payer: Medicare PPO | Source: Ambulatory Visit | Attending: Pulmonary Disease | Admitting: Pulmonary Disease

## 2021-04-27 DIAGNOSIS — K7460 Unspecified cirrhosis of liver: Secondary | ICD-10-CM | POA: Insufficient documentation

## 2021-04-27 DIAGNOSIS — J90 Pleural effusion, not elsewhere classified: Secondary | ICD-10-CM

## 2021-04-27 DIAGNOSIS — R0609 Other forms of dyspnea: Secondary | ICD-10-CM | POA: Insufficient documentation

## 2021-04-29 ENCOUNTER — Ambulatory Visit (INDEPENDENT_AMBULATORY_CARE_PROVIDER_SITE_OTHER): Payer: Medicare PPO

## 2021-04-29 ENCOUNTER — Other Ambulatory Visit: Payer: Self-pay

## 2021-04-29 DIAGNOSIS — G4733 Obstructive sleep apnea (adult) (pediatric): Secondary | ICD-10-CM | POA: Diagnosis not present

## 2021-04-29 DIAGNOSIS — R0683 Snoring: Secondary | ICD-10-CM

## 2021-05-03 ENCOUNTER — Telehealth: Payer: Self-pay | Admitting: Pulmonary Disease

## 2021-05-03 DIAGNOSIS — G4733 Obstructive sleep apnea (adult) (pediatric): Secondary | ICD-10-CM | POA: Diagnosis not present

## 2021-05-03 NOTE — Telephone Encounter (Signed)
ATC patient to go over HST results. LMTCB

## 2021-05-03 NOTE — Telephone Encounter (Signed)
HST 05/03/21 >> AHI 63.1, SpO2 low 58%.  Spent 178.6 min with SpO2 < 89%.   Please inform her that her sleep study shows severe obstructive sleep apnea with low oxygen levels.  If she is agreeable, then please schedule an in lab CPAP titration study.  If she would like to discuss in more detail first, then we can review this at her ROV on 05/20/21.

## 2021-05-04 NOTE — Telephone Encounter (Signed)
ATC patient. LMTCB with RDS office number  

## 2021-05-06 NOTE — Telephone Encounter (Signed)
Called and spoke to patient and patients daughter regarding results.   While on the phone both patient and daughter state that the patients O2 sats have been in the low 80's and patient has been using oxygen intermittently. The daughter states the patient has been trying to "wean herself off of the oxygen" but has been continuously dropping to the 80s on room air at rest. The patient also states that she feels she may have fluid build up in her abdomen as she is having more trouble with SOB and discomfort in her abdomen. She states she is planning to go to the emergency room today because she doesn't feel right and is concerned. She wanted to go over abdominal US results again. Went over results again with patient and she voiced understanding but wants to know if there was fluid seen in the [place].   The patient would like verification from Dr. Craige Cotta on whether or not she should be using oxygen and how many liters and also if she should go to the ER tonight. As well as verification that there was no fluid in her abdomen seen on abdominal US done on 04/27/2021    Dr. Craige Cotta please advise.

## 2021-05-06 NOTE — Telephone Encounter (Signed)
Ultrasound didn't show any fluid in her abdomen, but this could have developed since she had ultrasound 04/27/21.  She needs to wear her oxygen on a regular basis to keep her oxygen level above 90%.  She should be using 2 liters oxygen.  Based on her symptom description the fluid around her right lung could have gotten worse.  I recommend she go to the emergency room for further evaluation.

## 2021-05-06 NOTE — Telephone Encounter (Signed)
ATC patient/ patients daughter to inform. LMTCB

## 2021-05-06 NOTE — Telephone Encounter (Signed)
Called and went over recs with daughter Gilmore Laroche- ok per dpr) and patient. They voiced understanding of recs. Call was disconnected during conversation. ATC back and received voicemail box message.

## 2021-05-20 ENCOUNTER — Ambulatory Visit: Payer: Medicare PPO | Admitting: Pulmonary Disease

## 2021-05-26 ENCOUNTER — Ambulatory Visit: Payer: Medicare PPO | Admitting: Primary Care

## 2021-05-26 ENCOUNTER — Encounter: Payer: Self-pay | Admitting: Primary Care

## 2021-05-26 ENCOUNTER — Other Ambulatory Visit: Payer: Self-pay

## 2021-05-26 DIAGNOSIS — K7460 Unspecified cirrhosis of liver: Secondary | ICD-10-CM

## 2021-05-26 DIAGNOSIS — J90 Pleural effusion, not elsewhere classified: Secondary | ICD-10-CM

## 2021-05-26 DIAGNOSIS — I288 Other diseases of pulmonary vessels: Secondary | ICD-10-CM | POA: Insufficient documentation

## 2021-05-26 DIAGNOSIS — J45909 Unspecified asthma, uncomplicated: Secondary | ICD-10-CM | POA: Diagnosis not present

## 2021-05-26 DIAGNOSIS — I5033 Acute on chronic diastolic (congestive) heart failure: Secondary | ICD-10-CM

## 2021-05-26 DIAGNOSIS — Z8669 Personal history of other diseases of the nervous system and sense organs: Secondary | ICD-10-CM | POA: Diagnosis not present

## 2021-05-26 MED ORDER — BUDESONIDE-FORMOTEROL FUMARATE 80-4.5 MCG/ACT IN AERO: 2.0000 | INHALATION_SPRAY | Freq: Two times a day (BID) | RESPIRATORY_TRACT | 1 refills | Status: AC

## 2021-05-26 NOTE — Assessment & Plan Note (Signed)
-   Abd ultrasound on 04/25/21 showed changes of liver consistent with cirrhosis - LFTs were normal on most recent lab work - Referring to GI

## 2021-05-26 NOTE — Assessment & Plan Note (Addendum)
-   BNP was elevated during last visit, increased lasix 60mg  x 5 days. Leg swelling is down and weight is stable. Continue 40mg  dail. Advised she follow-up with PCP/cardiology.

## 2021-05-26 NOTE — Assessment & Plan Note (Addendum)
-   She has symptoms of dyspnea along with chest tightness and cough. She has moderate obstruction and restriction on pulmonary function testing without BD response. Moderate diffusion defect. HRCT was negative for ILD. She does report temporary improvement from SABA, using upwards of 5 times day. Recommend trial symbicort two puffs twice daily. FU in 2-4 weeks to evaluate responsed on scheduled bronchodilator regimen.

## 2021-05-26 NOTE — Assessment & Plan Note (Addendum)
-   HST 04/29/21 showed severe 63.1/hr with SpO2 low 58% (average 86%). Reviewed sleep study results and treatment options. She is open to trying CPAP but unsure she will be able to tolerate mask. Needs CPAP titration study. Continue oxygen 3L at bedtime.

## 2021-05-26 NOTE — Patient Instructions (Addendum)
Recommendations: Start Symbicort 80- take two puffs every morning and evening (rinse mouth after use) Use albuterol inhaler every 4-6 hours as needed only for breakthrough shortness of breath/wheezing  Continue lasix 40mg  daily  Wear 3L oxygen at bedtime    Referral: Gastroenterology re: liver cirrhosis   Orders: CPAP titration study re: severe OSA   Follow-up: 1 month with NP

## 2021-05-26 NOTE — Progress Notes (Signed)
@Patient  ID: Annette Moore, female    DOB: July 22, 1945, 76 y.o.   MRN: [id number]  Chief Complaint  Patient presents with   Follow-up    Patient states that she is still having shortness of breath and feels like she is holding onto fluid    Referring provider: Deborah Chalk, FNP  HPI: 76 year old female, former smoker quit 1978.  Past medical history significant for acute on chronic diastolic heart failure, history of asthma, right pleural effusion, sleep apnea, Lap-Band surgery, morbid obesity.  Patient of Dr. Halford Chessman, seen for initial consult on 07/27/2020. Ordered for CXR, pulmonary function testing and home sleep study.    Previous [facility] pulmonary encounter: 01/04/2021- Interim hx  Patient presents today for an overdue follow-up.  Chest x-ray in April showed persistent right greater than left effusions.  Underwent thoracentesis of the lung on 08/04/2020. No growth on fluid culture, cytology showed reactive mesothelial cells. PFTs and home sleep test have not been completed. She subsequently cancelled/ no-showed for several appointment after this procedure. She underwent a second thoracentesis in late August/September at outside facility. She saw Dr. Quillian Quince with [facility] on 12/10/20. Recurrent pleural effusion not felt to be coming from cardiac cause. Possibly related to pulmonary HTN. She has severe pulmonary hypertension likely from untreated sleep apnea, afib or HTN. Cardiology did not recommend heart catheterizations at this point. She is is following low salt diet ad taking lasix 40mg  daily. She is on Eliquis 5mg  twice daily for hx afib. She is doing alright today, no acute complaints. She has intermittent shortness of breath symptoms. She needed to use her Albuterol rescue inhaler yesterday.  02/09/21- Dr. Halford Chessman  She is here with her daughter.  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  She had repeat thoracentesis at [place] in August with 700 ml fluid  removed.  She was sent home with supplemental oxygen.  Uses 2 liters intermittently.  Has PFT scheduled for later this month.  Wasn't able to schedule home sleep study yet.  Has cough with clear sputum and intermittent wheeze.    Chest xray from September showed recurrence of right effusion.  04/12/2021 Patient presents today for 2 month follow-up with PFTs. Accompanied by her daughter.  PMH signifciant for OSA, pulmonary HTN, diastolic CHF, afib. She has symptoms of persistent dyspnea. Pulmonary function testing today showed moderate-severe restrictive lung disease without BD response. Moderate diffusion defect. Patient had a CXR in November 2022 that showed right > left pleural effusion. She has persistent pleural effusion, no cardiac interventions were planned. She has pulmonary HTN and possible cirrhosis. She was ordered for abdominal ultrasound to assess further but this was cancelled by patient and needs to be rescheduled. She was also ordered for HST which needs to be scheduled.   05/26/2021 - interim hx  Patient presents today for 4-8 week follow-up. She continues to have chronic shortness of breath symptoms. Some associated chest tightness and cough. Leg swelling is baseline, weight is stable. Taking lasix 40mg  daily. Wears 3L oxygen at bedtime. Review sleep study results, she is open to trying CPAP but unsure if she will be able to tolerate.    Allergies  Allergen Reactions   Codeine Hives    Itching   Sulfa Antibiotics Hives   Ibuprofen Other (See Comments)    States that her doctor told her not to take it because of her heart.    Immunization History  Administered Date(s) Administered   Influenza, High  Dose Seasonal PF 01/19/2016, 03/16/2017, 05/17/2018, 01/02/2019, 12/09/2019, 12/21/2020   Influenza-Unspecified 01/19/2016   PFIZER(Purple Top)SARS-COV-2 Vaccination 08/01/2019, 08/22/2019   Pneumococcal Conjugate-13 01/28/2014   Pneumococcal Polysaccharide-23 04/07/2008, 03/16/2017    Tdap 11/20/2008, 09/07/2019    Past Medical History:  Diagnosis Date   Allergy    Anxiety    Asthma    Atrial fibrillation (HCC)    Cellulitis of right leg    Chronic low back pain    Depression    GERD (gastroesophageal reflux disease)    Gout    Gout    Hyperlipidemia    Hypertension    Hypertension    Ischemic cerebrovascular accident (CVA) (HCC)    OSA (obstructive sleep apnea)    Osteopenia    Spinal stenosis    Tremor    Vitamin D deficiency     Tobacco History: Social History   Tobacco Use  Smoking Status Former   Types: Cigarettes   Quit date: 10/29/1976   Years since quitting: 44.6  Smokeless Tobacco Never   Counseling given: Not Answered   Outpatient Medications Prior to Visit  Medication Sig Dispense Refill   acetaminophen (TYLENOL) 325 MG tablet Take by mouth.     albuterol (PROVENTIL) (2.5 MG/3ML) 0.083% nebulizer solution Inhale into the lungs.     albuterol (VENTOLIN HFA) 108 (90 Base) MCG/ACT inhaler Inhale into the lungs.     allopurinol (ZYLOPRIM) 100 MG tablet Take 1 tablet by mouth daily.     ALPRAZolam (XANAX) 0.25 MG tablet Take by mouth.     apixaban (ELIQUIS) 5 MG TABS tablet TAKE 1 TABLET(5 MG) BY MOUTH TWICE DAILY     atorvastatin (LIPITOR) 10 MG tablet Take by mouth.     benzonatate (TESSALON) 100 MG capsule Take by mouth.     colchicine 0.6 MG tablet Take 1 tablet (0.6 mg total) by mouth daily. 30 tablet 0   diltiazem (TIAZAC) 240 MG 24 hr capsule Take 240 mg by mouth daily.     DULoxetine (CYMBALTA) 30 MG capsule Take 1 capsule by mouth daily.     famotidine (PEPCID) 20 MG tablet Take by mouth.     gabapentin (NEURONTIN) 100 MG capsule Take by mouth.     magnesium oxide (MAG-OX) 400 MG tablet Take 1 tablet by mouth 2 (two) times daily.     metoprolol tartrate (LOPRESSOR) 25 MG tablet Take 25 mg by mouth 2 (two) times daily.     Nebulizers MISC Use every 4-6 hours as needed     potassium chloride (KLOR-CON M) 10 MEQ tablet Take 1  tablet (10 mEq total) by mouth daily. 30 tablet 0   Vitamin D, Ergocalciferol, (DRISDOL) 1.25 MG (50000 UNIT) CAPS capsule Take 1 capsule by mouth 2 (two) times a week.     No facility-administered medications prior to visit.    Review of Systems  Review of Systems  Constitutional: Negative.   HENT: Negative.    Respiratory:  Positive for shortness of breath. Negative for cough.   Cardiovascular:  Positive for leg swelling.    Physical Exam  BP 110/80 (BP Location: Left Arm, Patient Position: Sitting, Cuff Size: Large)    Pulse 75    Temp 98 F (36.7 C) (Oral)    Ht 5\' 2"  (1.575 m)    Wt 220 lb (99.8 kg)    SpO2 100%    BMI 40.24 kg/m  Physical Exam Constitutional:      Appearance: Normal appearance.  HENT:  Head: Normocephalic and atraumatic.     Mouth/Throat:     Mouth: Mucous membranes are moist.     Pharynx: Oropharynx is clear.  Cardiovascular:     Rate and Rhythm: Normal rate.     Comments: + 2-3 BLE edema  Pulmonary:     Effort: Pulmonary effort is normal.     Breath sounds: Normal breath sounds. No wheezing or rhonchi.  Musculoskeletal:        General: Normal range of motion.  Skin:    General: Skin is warm and dry.  Neurological:     General: No focal deficit present.     Mental Status: She is alert and oriented to person, place, and time. Mental status is at baseline.  Psychiatric:        Mood and Affect: Mood normal.        Behavior: Behavior normal.        Thought Content: Thought content normal.        Judgment: Judgment normal.     Lab Results:  CBC    Component Value Date/Time   WBC 6.8 10/23/2020 1525   RBC 5.43 (H) 10/23/2020 1525   HGB 12.9 10/23/2020 1525   HCT 42.8 10/23/2020 1525   PLT 243 10/23/2020 1525   MCV 78.8 (L) 10/23/2020 1525   MCH 23.8 (L) 10/23/2020 1525   MCHC 30.1 10/23/2020 1525   RDW 15.8 (H) 10/23/2020 1525   LYMPHSABS 1.7 10/23/2020 1525   MONOABS 1.0 10/23/2020 1525   EOSABS 0.3 10/23/2020 1525   BASOSABS 0.0  10/23/2020 1525    BMET    Component Value Date/Time   NA 142 04/22/2021 1240   K 3.3 (L) 04/22/2021 1240   CL 102 04/22/2021 1240   CO2 31 04/22/2021 1240   GLUCOSE 82 04/22/2021 1240   BUN 6 04/22/2021 1240   CREATININE 0.77 04/22/2021 1240   CALCIUM 9.4 04/22/2021 1240   GFRNONAA >60 10/23/2020 1525   GFRAA >60 02/11/2016 1905    BNP    Component Value Date/Time   BNP 177.0 (H) 02/25/2020 1952    ProBNP    Component Value Date/Time   PROBNP 279.0 (H) 04/22/2021 1240    Imaging: US Abdomen Complete  Result Date: 04/27/2021 CLINICAL DATA:  Cirrhosis EXAM: ABDOMEN ULTRASOUND COMPLETE COMPARISON:  Abdominal ultrasound 03/08/2006 FINDINGS: Gallbladder: Surgically absent Common bile duct: Diameter: 7 mm Liver: Increased echogenicity of the parenchyma with no focal mass identified. Portal vein is patent on color Doppler imaging with normal direction of blood flow towards the liver. IVC: No abnormality visualized. Pancreas: Visualized portion unremarkable. Spleen: Size and appearance within normal limits. Right Kidney: Length: 9.6 cm. Echogenicity within normal limits. No mass or hydronephrosis visualized. Left Kidney: Length: 10.5 cm. Echogenicity within normal limits. No mass or hydronephrosis visualized. Abdominal aorta: No aneurysm visualized.  Atherosclerotic plaques. Other findings: Right pleural effusion. IMPRESSION: 1. Increased echogenicity of the liver parenchyma with no focal mass identified. 2. Right pleural effusion. Electronically Signed   By: Jannifer Hick M.D.   On: 04/27/2021 15:58   CT CHEST HIGH RESOLUTION  Result Date: 04/27/2021 CLINICAL DATA:  Right pleural effusion, shortness of breath on exertion. EXAM: CT CHEST WITHOUT CONTRAST TECHNIQUE: Multidetector CT imaging of the chest was performed following the standard protocol without intravenous contrast. High resolution imaging of the lungs, as well as inspiratory and expiratory imaging, was performed.  RADIATION DOSE REDUCTION: This exam was performed according to the departmental dose-optimization program which includes automated exposure control,  adjustment of the mA and/or kV according to patient size and/or use of iterative reconstruction technique. COMPARISON:  Chest radiograph 02/09/2021. FINDINGS: Cardiovascular: Atherosclerotic calcification of the aorta, aortic valve and coronary arteries. Pulmonic trunk and heart are enlarged. No pericardial effusion. Mediastinum/Nodes: Mediastinal lymph nodes are not enlarged by CT size criteria. Hilar regions are difficult to evaluate without IV contrast. No axillary adenopathy. Esophagus is unremarkable. Lungs/Pleura: Image quality is degraded by respiratory motion. Partially loculated moderate right pleural effusion. Subsegmental atelectasis in the right middle and right lower lobes. Mild septal thickening. No left pleural fluid. Airway is unremarkable. Upper Abdomen: Liver margin is irregular with hypertrophy of the left hepatic lobe and caudate. Visualized portions of the adrenal glands, left kidney, spleen and pancreas are otherwise unremarkable. Percutaneous laparoscopic gastric band in place. Musculoskeletal: Degenerative changes in the spine. No worrisome lytic or sclerotic lesions. IMPRESSION: 1. Partially loculated moderate right pleural effusion with associated atelectasis in the right middle and right lower lobes. 2. Mild septal thickening is indicative of pulmonary edema. 3. Assessment for pulmonary fibrosis is therefore limited. No findings to strongly suspect underlying interstitial lung disease. 4. Cirrhosis. 5. Aortic atherosclerosis (ICD10-I70.0). Coronary artery calcification. 6. Enlarged pulmonic trunk, indicative of pulmonary arterial hypertension. Electronically Signed   By: Lorin Picket M.D.   On: 04/27/2021 14:48     Assessment & Plan:   Asthma - She has symptoms of dyspnea along with chest tightness and cough. She has moderate  obstruction and restriction on pulmonary function testing without BD response. Moderate diffusion defect. HRCT was negative for ILD. She does report temporary improvement from SABA, using upwards of 5 times day. Recommend trial symbicort 72mcg two puffs twice daily. FU in 2-4 weeks to evaluate responsed on scheduled bronchodilator regimen.   Recurrent right pleural effusion - HRCT in January 2023 showed partially loculated moderate right pleural effusion. Previous fluid analysis consistent with transudate. Continue lasix and referring to GI.   Acute on chronic diastolic CHF (congestive heart failure) (HCC) - BNP was elevated during last visit, increased lasix 60mg  x 5 days. Leg swelling is down and weight is stable. Continue 40mg  dail. Advised she follow-up with PCP/cardiology.   Hx of sleep apnea - HST 04/29/21 showed severe 63.1/hr with SpO2 low 58% (average 86%). Reviewed sleep study results and treatment options. She is open to trying CPAP but unsure she will be able to tolerate mask. Needs CPAP titration study. Continue oxygen 3L at bedtime.   Liver cirrhosis ([place]) - Abd ultrasound on 04/25/21 showed changes of liver consistent with cirrhosis - LFTs were normal on most recent lab work - Referring to [place], NP 05/26/2021

## 2021-05-26 NOTE — Assessment & Plan Note (Signed)
-   Needs echocardiogram

## 2021-05-26 NOTE — Assessment & Plan Note (Addendum)
-   HRCT in January 2023 showed partially loculated moderate right pleural effusion. Previous fluid analysis consistent with transudate. Continue lasix and referring to GI.

## 2021-06-07 ENCOUNTER — Ambulatory Visit (HOSPITAL_BASED_OUTPATIENT_CLINIC_OR_DEPARTMENT_OTHER): Admission: RE | Admit: 2021-06-07 | Payer: Medicare PPO | Source: Ambulatory Visit

## 2021-06-13 ENCOUNTER — Ambulatory Visit (HOSPITAL_BASED_OUTPATIENT_CLINIC_OR_DEPARTMENT_OTHER): Admission: RE | Admit: 2021-06-13 | Payer: Medicare PPO | Source: Ambulatory Visit

## 2021-06-23 ENCOUNTER — Ambulatory Visit (HOSPITAL_COMMUNITY)
Admission: RE | Admit: 2021-06-23 | Discharge: 2021-06-23 | Disposition: A | Discharge status: Home / Self Care | Payer: Medicare PPO | Source: Ambulatory Visit | Attending: Primary Care | Admitting: Primary Care

## 2021-06-23 ENCOUNTER — Ambulatory Visit (INDEPENDENT_AMBULATORY_CARE_PROVIDER_SITE_OTHER): Payer: Medicare PPO

## 2021-06-23 ENCOUNTER — Encounter: Payer: Self-pay | Admitting: Primary Care

## 2021-06-23 ENCOUNTER — Ambulatory Visit: Payer: Medicare PPO | Admitting: Primary Care

## 2021-06-23 ENCOUNTER — Other Ambulatory Visit: Payer: Self-pay

## 2021-06-23 VITALS — BP 120/78 | HR 50 | Temp 97.8°F | Ht 62.0 in | Wt 220.0 lb

## 2021-06-23 DIAGNOSIS — I288 Other diseases of pulmonary vessels: Secondary | ICD-10-CM | POA: Insufficient documentation

## 2021-06-23 DIAGNOSIS — I340 Nonrheumatic mitral (valve) insufficiency: Secondary | ICD-10-CM | POA: Diagnosis not present

## 2021-06-23 DIAGNOSIS — R0602 Shortness of breath: Secondary | ICD-10-CM | POA: Diagnosis not present

## 2021-06-23 DIAGNOSIS — I872 Venous insufficiency (chronic) (peripheral): Secondary | ICD-10-CM | POA: Diagnosis not present

## 2021-06-23 DIAGNOSIS — G4730 Sleep apnea, unspecified: Secondary | ICD-10-CM | POA: Diagnosis not present

## 2021-06-23 DIAGNOSIS — K7460 Unspecified cirrhosis of liver: Secondary | ICD-10-CM | POA: Diagnosis not present

## 2021-06-23 DIAGNOSIS — R0600 Dyspnea, unspecified: Secondary | ICD-10-CM | POA: Diagnosis present

## 2021-06-23 DIAGNOSIS — I272 Pulmonary hypertension, unspecified: Secondary | ICD-10-CM | POA: Diagnosis not present

## 2021-06-23 DIAGNOSIS — J45909 Unspecified asthma, uncomplicated: Secondary | ICD-10-CM

## 2021-06-23 DIAGNOSIS — J90 Pleural effusion, not elsewhere classified: Secondary | ICD-10-CM

## 2021-06-23 DIAGNOSIS — Z8669 Personal history of other diseases of the nervous system and sense organs: Secondary | ICD-10-CM

## 2021-06-23 DIAGNOSIS — I5033 Acute on chronic diastolic (congestive) heart failure: Secondary | ICD-10-CM

## 2021-06-23 LAB — COMPREHENSIVE METABOLIC PANEL
ALT: 13 U/L (ref 0–35)
AST: 22 U/L (ref 0–37)
Albumin: 3.7 g/dL (ref 3.5–5.2)
Alkaline Phosphatase: 100 U/L (ref 39–117)
BUN: 8 mg/dL (ref 6–23)
CO2: 29 mEq/L (ref 19–32)
Calcium: 9.4 mg/dL (ref 8.4–10.5)
Chloride: 103 mEq/L (ref 96–112)
Creatinine, Ser: 0.82 mg/dL (ref 0.40–1.20)
GFR: 70.93 mL/min (ref >60.00)
Glucose, Bld: 87 mg/dL (ref 70–99)
Potassium: 3.4 mEq/L — ABNORMAL LOW (ref 3.5–5.1)
Sodium: 141 mEq/L (ref 135–145)
Total Bilirubin: 0.6 mg/dL (ref 0.2–1.2)
Total Protein: 6.7 g/dL (ref 6.0–8.3)

## 2021-06-23 LAB — BRAIN NATRIURETIC PEPTIDE: Pro B Natriuretic peptide (BNP): 333 pg/mL — ABNORMAL HIGH (ref 0.0–100.0)

## 2021-06-23 LAB — ECHOCARDIOGRAM COMPLETE: S' Lateral: 2.3 cm

## 2021-06-23 MED ORDER — PERFLUTREN LIPID MICROSPHERE
1.0000 mL | INTRAVENOUS | Status: AC | PRN
  Administered 2021-06-23: 4 mL via INTRAVENOUS
  Filled 2021-06-23: qty 10

## 2021-06-23 MED ORDER — LEVOFLOXACIN 500 MG PO TABS: 500.0000 mg | ORAL_TABLET | Freq: Every day | ORAL | 0 refills | Status: DC

## 2021-06-23 NOTE — Progress Notes (Signed)
Please let patient know CXR showed patchy infiltrates right lower lung suggesting pneumonia. I will send in abx. She also has an enlarged heart and moderate pleural effusion of right.I am still waiting on labs before I adjust her diuretic. Echocardiogram was limited d.t afib. Does she have a cardiologist she is following with currently, I see a note from September with a Dr. Tollie Pizza

## 2021-06-23 NOTE — Patient Instructions (Addendum)
Recommendations: ?Stop Symbicort ?Start Breztri take two puffs morning and evening  ?I will adjust your diuretics after CXR and labs come back  ?Try Eucerin cream to legs ?Try melatonin 3mg  at bedtime, if after 3 days you see no improvement in sleep you can increase 5mg   ? ?Referral: ?Gastroenterology ?Dermatology  ? ?Orders: ?CXR and labs today  ?CPAP titration study ? ?Follow-up: ?2 weeks with [facility] AND please schedule first available with Dr. Halford Chessman ?

## 2021-06-23 NOTE — Progress Notes (Signed)
?  Echocardiogram ?2D Echocardiogram has been performed. ? ?Delcie Roch ?06/23/2021, 9:29 AM ?

## 2021-06-23 NOTE — Progress Notes (Signed)
? ?@Patient  ID: Annette Moore, female    DOB: 1945/08/10, 76 y.o.   MRN: [id number] ? ?Chief Complaint  ?Patient presents with  ? Follow-up  ?  She is having to use her Oxygen and her back up inhaler more since last OV.   ? ? ?Referring provider: ?Deborah Chalk, FNP ? ?HPI: ?76 year old female, former smoker quit 1978.  Past medical history significant for acute on chronic diastolic heart failure, history of asthma, right pleural effusion, sleep apnea, Lap-Band surgery, morbid obesity.  Patient of Dr. Halford Chessman, seen for initial consult on 07/27/2020. Ordered for CXR, pulmonary function testing and home sleep study.  ? ? ?Previous [facility] pulmonary encounter: ?01/04/2021 ?Patient presents today for an overdue follow-up.  Chest x-ray in April showed persistent right greater than left effusions.  Underwent thoracentesis of the lung on 08/04/2020. No growth on fluid culture, cytology showed reactive mesothelial cells. PFTs and home sleep test have not been completed. She subsequently cancelled/ no-showed for several appointment after this procedure. She underwent a second thoracentesis in late August/September at outside facility. She saw Dr. Quillian Quince with [facility] on 12/10/20. Recurrent pleural effusion not felt to be coming from cardiac cause. Possibly related to pulmonary HTN. She has severe pulmonary hypertension likely from untreated sleep apnea, afib or HTN. Cardiology did not recommend heart catheterizations at this point. She is is following low salt diet ad taking lasix 40mg  daily. She is on Eliquis 5mg  twice daily for hx afib. She is doing alright today, no acute complaints. She has intermittent shortness of breath symptoms. She needed to use her Albuterol rescue inhaler yesterday. ? ?02/09/21- Dr. Halford Chessman  ?She is here with her daughter. ? 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  She had repeat thoracentesis at [place] in August with 700 ml fluid removed.  She was sent home with  supplemental oxygen.  Uses 2 liters intermittently.  Has PFT scheduled for later this month.  Wasn't able to schedule home sleep study yet.  Has cough with clear sputum and intermittent wheeze.   ? ?Chest xray from September showed recurrence of right effusion. ? ?04/12/2021 ?Patient presents today for 2 month follow-up with PFTs. Accompanied by her daughter.  PMH signifciant for OSA, pulmonary HTN, diastolic CHF, afib. She has symptoms of persistent dyspnea. Pulmonary function testing today showed moderate-severe restrictive lung disease without BD response. Moderate diffusion defect. Patient had a CXR in November 2022 that showed right > left pleural effusion. She has persistent pleural effusion, no cardiac interventions were planned. She has pulmonary HTN and possible cirrhosis. She was ordered for abdominal ultrasound to assess further but this was cancelled by patient and needs to be rescheduled. She was also ordered for HST which needs to be scheduled.  ? ?05/26/2021  ?Patient presents today for 4-8 week follow-up. She continues to have chronic shortness of breath symptoms. Some associated chest tightness and cough. Leg swelling is baseline, weight is stable. Taking lasix 40mg  daily. Wears 3L oxygen at bedtime. Review sleep study results, she is open to trying CPAP but unsure if she will be able to tolerate.  ? ?06/23/2021 - Interim hx  ?Patient presents today for 1 month follow-up. During her last visit she had reported symptoms of dyspnea along with chest tightness and cough. She has moderate obstruction/restriction on pulmonary function testing without BD response. She does report improvement from SABA, using upwards for 5 times a day. She was given trial of Symbicort 63mcg to use  twice daily. She also had an abdominal ultrasound in January which showed changes of her liver consistent with cirrhosis. LFTs were normal. She feels her breathing is worse. She has seen no improvement with Symbicort. Still using  albuterol frequently with relief. She has associated abdominal and lower extremity swelling. She is currently taking lasix 40mg  daily.  ? ? ?Allergies  ?Allergen Reactions  ? Codeine Hives  ?  Itching  ? Sulfa Antibiotics Hives  ? Ibuprofen Other (See Comments)  ?  States that her doctor told her not to take it because of her heart.  ? ? ?Immunization History  ?Administered Date(s) Administered  ? Influenza, High Dose Seasonal PF 01/19/2016, 03/16/2017, 05/17/2018, 01/02/2019, 12/09/2019, 12/21/2020  ? Influenza-Unspecified 01/19/2016  ? PFIZER(Purple Top)SARS-COV-2 Vaccination 08/01/2019, 08/22/2019  ? Pneumococcal Conjugate-13 01/28/2014  ? Pneumococcal Polysaccharide-23 04/07/2008, 03/16/2017  ? Tdap 11/20/2008, 09/07/2019  ? ? ?Past Medical History:  ?Diagnosis Date  ? Allergy   ? Anxiety   ? Asthma   ? Atrial fibrillation ([place])   ? Cellulitis of right leg   ? Chronic low back pain   ? Depression   ? GERD (gastroesophageal reflux disease)   ? Gout   ? Gout   ? Hyperlipidemia   ? Hypertension   ? Hypertension   ? Ischemic cerebrovascular accident (CVA) ([place])   ? OSA (obstructive sleep apnea)   ? Osteopenia   ? Spinal stenosis   ? Tremor   ? Vitamin D deficiency   ? ? ?Tobacco History: ?Social History  ? ?Tobacco Use  ?Smoking Status Former  ? Types: Cigarettes  ? Quit date: 10/29/1976  ? Years since quitting: 44.6  ?Smokeless Tobacco Never  ? ?Counseling given: Not Answered ? ? ?Outpatient Medications Prior to Visit  ?Medication Sig Dispense Refill  ? acetaminophen (TYLENOL) 325 MG tablet Take by mouth.    ? albuterol (PROVENTIL) (2.5 MG/3ML) 0.083% nebulizer solution Inhale into the lungs.    ? albuterol (VENTOLIN HFA) 108 (90 Base) MCG/ACT inhaler Inhale into the lungs.    ? allopurinol (ZYLOPRIM) 100 MG tablet Take 1 tablet by mouth daily.    ? ALPRAZolam (XANAX) 0.25 MG tablet Take by mouth.    ? apixaban (ELIQUIS) 5 MG TABS tablet TAKE 1 TABLET(5 MG) BY MOUTH TWICE DAILY    ? atorvastatin (LIPITOR) 10 MG  tablet Take by mouth.    ? benzonatate (TESSALON) 100 MG capsule Take by mouth.    ? budesonide-formoterol (SYMBICORT) 80-4.5 MCG/ACT inhaler Inhale 2 puffs into the lungs in the morning and at bedtime. 1 each 1  ? colchicine 0.6 MG tablet Take 1 tablet (0.6 mg total) by mouth daily. 30 tablet 0  ? diltiazem (TIAZAC) 240 MG 24 hr capsule Take 240 mg by mouth daily.    ? DULoxetine (CYMBALTA) 30 MG capsule Take 1 capsule by mouth daily.    ? famotidine (PEPCID) 20 MG tablet Take by mouth.    ? gabapentin (NEURONTIN) 100 MG capsule Take by mouth.    ? magnesium oxide (MAG-OX) 400 MG tablet Take 1 tablet by mouth 2 (two) times daily.    ? metoprolol tartrate (LOPRESSOR) 25 MG tablet Take 25 mg by mouth 2 (two) times daily.    ? Nebulizers MISC Use every 4-6 hours as needed    ? potassium chloride (KLOR-CON M) 10 MEQ tablet Take 1 tablet (10 mEq total) by mouth daily. 30 tablet 0  ? Vitamin D, Ergocalciferol, (DRISDOL) 1.25 MG (50000 UNIT) CAPS capsule Take  1 capsule by mouth 2 (two) times a week.    ? perflutren lipid microspheres (DEFINITY) IV suspension     ? ?No facility-administered medications prior to visit.  ? ? ?Review of Systems ? ?Review of Systems  ?Constitutional: Negative.   ?HENT: Negative.    ?Respiratory:  Positive for cough, shortness of breath and wheezing. Negative for chest tightness.   ?Cardiovascular:  Positive for leg swelling.  ?Gastrointestinal:  Positive for abdominal distention.  ? ? ?Physical Exam ? ?BP 120/78 (BP Location: Left Arm, Patient Position: Sitting, Cuff Size: Large)   Pulse (!) 50   Temp 97.8 ?F (36.6 ?C) (Oral)   Ht 5\' 2"  (1.575 m)   Wt 220 lb (99.8 kg)   SpO2 95%   BMI 40.24 kg/m?  ?Physical Exam ?Constitutional:   ?   General: She is not in acute distress. ?   Appearance: Normal appearance. She is obese.  ?HENT:  ?   Head: Normocephalic and atraumatic.  ?   Mouth/Throat:  ?   Comments: Deferred d/t masking ?Cardiovascular:  ?   Rate and Rhythm: Normal rate and regular  rhythm.  ?   Comments: +3 BLE edema ?Pulmonary:  ?   Effort: Pulmonary effort is normal.  ?   Breath sounds: Wheezing and rales present.  ?Musculoskeletal:     ?   General: Normal range of motion.  ?   Comments: In WC  ?Sk

## 2021-06-24 NOTE — Progress Notes (Signed)
BNP remains elevated, have her take lasix 60mg  daily x 1 week and extra KCL when taking higher lasix dose. Needs to follow-up with cardiology

## 2021-06-27 ENCOUNTER — Ambulatory Visit (HOSPITAL_BASED_OUTPATIENT_CLINIC_OR_DEPARTMENT_OTHER): Payer: Medicare PPO | Admitting: Pulmonary Disease

## 2021-06-27 DIAGNOSIS — I872 Venous insufficiency (chronic) (peripheral): Secondary | ICD-10-CM | POA: Insufficient documentation

## 2021-06-27 NOTE — Assessment & Plan Note (Addendum)
-   Restriction d/t obesity and persistent pleural effusions. HRCT negative for ILD. Asthma less likely as sole cause of her dyspnea dyspnea but she does notice improvement with SABA use. Will trial Breztri two puffs twice daily. Follow-up in 2 weeks with [facility] and please schedule first available with Dr. Craige Cotta ?

## 2021-06-27 NOTE — Progress Notes (Signed)
Reviewed and agree with assessment/plan. ? ? ?Coralyn Helling, MD ?[facility] ?06/27/2021, 9:40 AM ?Pager:  [phone] ? ?

## 2021-06-27 NOTE — Assessment & Plan Note (Signed)
Referring to dermatology. 

## 2021-06-27 NOTE — Assessment & Plan Note (Addendum)
-   Previously pleural fluid analysis was consistent with transudate, possibly d/t HF and/or liver cirrhosis  ?

## 2021-06-27 NOTE — Assessment & Plan Note (Signed)
-   Abd ultrasound on 04/25/21 showed changes of liver consistent with cirrhosis °- LFTs were normal on most recent lab work °- Referring to GI  °

## 2021-06-27 NOTE — Assessment & Plan Note (Signed)
-   Patient continues to have dyspnea symptoms with associated abdominal and LE edema. Checking CXR and BNP today. Currently on 40mg  lasix daily. We will adjust diuretics accordingly and have her follow-up with cardiology  ?

## 2021-06-27 NOTE — Assessment & Plan Note (Signed)
-   HST on 04/29/21 showed severe OSA, AHI 63/hr with SpO2 534% (average 86%). Needs CPAP titration study. Continue 3L oxygen at bedtime.  ?

## 2021-06-28 ENCOUNTER — Encounter (HOSPITAL_BASED_OUTPATIENT_CLINIC_OR_DEPARTMENT_OTHER): Payer: Medicare PPO | Admitting: Pulmonary Disease

## 2021-07-08 ENCOUNTER — Ambulatory Visit: Payer: Medicare PPO | Admitting: Primary Care

## 2021-07-08 ENCOUNTER — Telehealth: Payer: Self-pay | Admitting: Primary Care

## 2021-07-08 DIAGNOSIS — R0602 Shortness of breath: Secondary | ICD-10-CM

## 2021-07-08 DIAGNOSIS — I272 Pulmonary hypertension, unspecified: Secondary | ICD-10-CM

## 2021-07-08 NOTE — Progress Notes (Deleted)
? ?@Patient  ID: Annette Moore, female    DOB: 1945-12-10, 76 y.o.   MRN: [id number] ? ?No chief complaint on file. ? ? ?Referring provider: ?Deborah Chalk, FNP ? ?HPI: ?76 year old female, former smoker quit 1978.  Past medical history significant for acute on chronic diastolic heart failure, history of asthma, right pleural effusion, sleep apnea, Lap-Band surgery, morbid obesity.  Patient of Dr. Halford Chessman, seen for initial consult on 07/27/2020. Ordered for CXR, pulmonary function testing and home sleep study.  ? ? ?Previous [facility] pulmonary encounter: ?01/04/2021 ?Patient presents today for an overdue follow-up.  Chest x-ray in April showed persistent right greater than left effusions.  Underwent thoracentesis of the lung on 08/04/2020. No growth on fluid culture, cytology showed reactive mesothelial cells. PFTs and home sleep test have not been completed. She subsequently cancelled/ no-showed for several appointment after this procedure. She underwent a second thoracentesis in late August/September at outside facility. She saw Dr. Quillian Quince with [facility] on 12/10/20. Recurrent pleural effusion not felt to be coming from cardiac cause. Possibly related to pulmonary HTN. She has severe pulmonary hypertension likely from untreated sleep apnea, afib or HTN. Cardiology did not recommend heart catheterizations at this point. She is is following low salt diet ad taking lasix 40mg  daily. She is on Eliquis 5mg  twice daily for hx afib. She is doing alright today, no acute complaints. She has intermittent shortness of breath symptoms. She needed to use her Albuterol rescue inhaler yesterday. ? ?02/09/21- Dr. Halford Chessman  ?She is here with her daughter. ? 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  She had repeat thoracentesis at [facility] [place] in August with 700 ml fluid removed.  She was sent home with supplemental oxygen.  Uses 2 liters intermittently.  Has PFT scheduled for later this month.  Wasn't able to  schedule home sleep study yet.  Has cough with clear sputum and intermittent wheeze.   ? ?Chest xray from September showed recurrence of right effusion. ? ?04/12/2021 ?Patient presents today for 2 month follow-up with PFTs. Accompanied by her daughter.  PMH signifciant for OSA, pulmonary HTN, diastolic CHF, afib. She has symptoms of persistent dyspnea. Pulmonary function testing today showed moderate-severe restrictive lung disease without BD response. Moderate diffusion defect. Patient had a CXR in November 2022 that showed right > left pleural effusion. She has persistent pleural effusion, no cardiac interventions were planned. She has pulmonary HTN and possible cirrhosis. She was ordered for abdominal ultrasound to assess further but this was cancelled by patient and needs to be rescheduled. She was also ordered for HST which needs to be scheduled.  ? ?05/26/2021  ?Patient presents today for 4-8 week follow-up. She continues to have chronic shortness of breath symptoms. Some associated chest tightness and cough. Leg swelling is baseline, weight is stable. Taking lasix 40mg  daily. Wears 3L oxygen at bedtime. Review sleep study results, she is open to trying CPAP but unsure if she will be able to tolerate.  ? ?06/23/2021 ?Patient presents today for 1 month follow-up. During her last visit she had reported symptoms of dyspnea along with chest tightness and cough. She has moderate obstruction/restriction on pulmonary function testing without BD response. She does report improvement from SABA, using upwards for 5 times a day. She was given trial of Symbicort 43mcg to use twice daily. She also had an abdominal ultrasound in January which showed changes of her liver consistent with cirrhosis. LFTs were normal. She feels her breathing is worse. She  has seen no improvement with Symbicort. Still using albuterol frequently with relief. She has associated abdominal and lower extremity swelling. She is currently taking lasix 40mg   daily.  ? ?BNP remains elevated, have her take lasix 60mg  daily x 1 week and extra 53meq KCL when taking higher lasix dose. Needs to follow-up with cardiology  ? ?07/08/2021 - Interim hx  ?Patient presents today for 2 week follow-up. ? ? ?Echo showed severe elevated pulmonary artery systolic pressure  ?D-dimer, on eliquis ? CTA ?Sleep apnea, ordered for titration study  ?No ILD  ? ? ?Cardiac testing:  ?Echocardiogram 06/23/21 >> Normal EG 65-70%, mild LVH. Severely elevated pulmonary artery systolic pressure, estimated 76.2 mmHg. Several dilated Left and right atrium.  ? ? ? ?Allergies  ?Allergen Reactions  ? Codeine Hives  ?  Itching  ? Sulfa Antibiotics Hives  ? Ibuprofen Other (See Comments)  ?  States that her doctor told her not to take it because of her heart.  ? ? ?Immunization History  ?Administered Date(s) Administered  ? Influenza, High Dose Seasonal PF 01/19/2016, 03/16/2017, 05/17/2018, 01/02/2019, 12/09/2019, 12/21/2020  ? Influenza-Unspecified 01/19/2016  ? PFIZER(Purple Top)SARS-COV-2 Vaccination 08/01/2019, 08/22/2019  ? Pneumococcal Conjugate-13 01/28/2014  ? Pneumococcal Polysaccharide-23 04/07/2008, 03/16/2017  ? Tdap 11/20/2008, 09/07/2019  ? ? ?Past Medical History:  ?Diagnosis Date  ? Allergy   ? Anxiety   ? Asthma   ? Atrial fibrillation ([place])   ? Cellulitis of right leg   ? Chronic low back pain   ? Depression   ? GERD (gastroesophageal reflux disease)   ? Gout   ? Gout   ? Hyperlipidemia   ? Hypertension   ? Hypertension   ? Ischemic cerebrovascular accident (CVA) ([place])   ? OSA (obstructive sleep apnea)   ? Osteopenia   ? Spinal stenosis   ? Tremor   ? Vitamin D deficiency   ? ? ?Tobacco History: ?Social History  ? ?Tobacco Use  ?Smoking Status Former  ? Types: Cigarettes  ? Quit date: 10/29/1976  ? Years since quitting: 44.7  ?Smokeless Tobacco Never  ? ?Counseling given: Not Answered ? ? ?Outpatient Medications Prior to Visit  ?Medication Sig Dispense Refill  ? acetaminophen (TYLENOL) 325 MG  tablet Take by mouth.    ? albuterol (PROVENTIL) (2.5 MG/3ML) 0.083% nebulizer solution Inhale into the lungs.    ? albuterol (VENTOLIN HFA) 108 (90 Base) MCG/ACT inhaler Inhale into the lungs.    ? allopurinol (ZYLOPRIM) 100 MG tablet Take 1 tablet by mouth daily.    ? ALPRAZolam (XANAX) 0.25 MG tablet Take by mouth.    ? apixaban (ELIQUIS) 5 MG TABS tablet TAKE 1 TABLET(5 MG) BY MOUTH TWICE DAILY    ? atorvastatin (LIPITOR) 10 MG tablet Take by mouth.    ? benzonatate (TESSALON) 100 MG capsule Take by mouth.    ? budesonide-formoterol (SYMBICORT) 80-4.5 MCG/ACT inhaler Inhale 2 puffs into the lungs in the morning and at bedtime. 1 each 1  ? colchicine 0.6 MG tablet Take 1 tablet (0.6 mg total) by mouth daily. 30 tablet 0  ? diltiazem (TIAZAC) 240 MG 24 hr capsule Take 240 mg by mouth daily.    ? DULoxetine (CYMBALTA) 30 MG capsule Take 1 capsule by mouth daily.    ? famotidine (PEPCID) 20 MG tablet Take by mouth.    ? gabapentin (NEURONTIN) 100 MG capsule Take by mouth.    ? levofloxacin (LEVAQUIN) 500 MG tablet Take 1 tablet (500 mg total) by mouth daily.  7 tablet 0  ? magnesium oxide (MAG-OX) 400 MG tablet Take 1 tablet by mouth 2 (two) times daily.    ? metoprolol tartrate (LOPRESSOR) 25 MG tablet Take 25 mg by mouth 2 (two) times daily.    ? Nebulizers MISC Use every 4-6 hours as needed    ? potassium chloride (KLOR-CON M) 10 MEQ tablet Take 1 tablet (10 mEq total) by mouth daily. 30 tablet 0  ? Vitamin D, Ergocalciferol, (DRISDOL) 1.25 MG (50000 UNIT) CAPS capsule Take 1 capsule by mouth 2 (two) times a week.    ? ?No facility-administered medications prior to visit.  ? ? ? ? ?Review of Systems ? ?Review of Systems ? ? ?Physical Exam ? ?There were no vitals taken for this visit. ?Physical Exam  ? ?Lab Results: ? ?CBC ?   ?Component Value Date/Time  ? WBC 6.8 10/23/2020 1525  ? RBC 5.43 (H) 10/23/2020 1525  ? HGB 12.9 10/23/2020 1525  ? HCT 42.8 10/23/2020 1525  ? PLT 243 10/23/2020 1525  ? MCV 78.8 (L)  10/23/2020 1525  ? MCH 23.8 (L) 10/23/2020 1525  ? MCHC 30.1 10/23/2020 1525  ? RDW 15.8 (H) 10/23/2020 1525  ? LYMPHSABS 1.7 10/23/2020 1525  ? MONOABS 1.0 10/23/2020 1525  ? EOSABS 0.3 10/23/2020 1525  ? BAS

## 2021-07-08 NOTE — Telephone Encounter (Signed)
Patient no showed for today's follow-up, this needs to be rescheduled. Echocardiogram showed severe pulmonary HTN. She has severe OSA, AHI 63/hr. Looks like she cancelled her CPAP titration study, this also needs to be re-ordered/scheduled. Sleep apnea can cause pulm HTN. I ordered D-dimer to ensure she does not have a PE (please have her come in for labs). Also needs to see cardiology to evaluate need for right heart cath (I placed referral) ?

## 2021-07-08 NOTE — Telephone Encounter (Signed)
Called pt and spoke with her daughter Gilmore Laroche about pt not showing up for appt. Gilmore Laroche stated that pt does not drive and she did not know anything about her mom having an appt scheduled today. ? ?Stated to her that [facility] had a cancellation this afternoon 3/31 at 2pm but she said she cannot get off work to bring her mom to that appt. Pt's appt has been rescheduled for 4/6. Nothing further needed. ?

## 2021-07-14 ENCOUNTER — Ambulatory Visit: Payer: Medicare PPO | Admitting: Primary Care

## 2021-07-14 NOTE — Progress Notes (Deleted)
? ?@Patient  ID: , female    DOB: 1945-05-28, 76 y.o.   MRN: [id number] ? ?No chief complaint on file. ? ? ?Referring provider: ?[id number], FNP ? ?HPI: ? ?76 year old female, former smoker quit 1978.  Past medical history significant for acute on chronic diastolic heart failure, history of asthma, right pleural effusion, sleep apnea, Lap-Band surgery, morbid obesity.  Patient of Dr. 1979, seen for initial consult on 07/27/2020. Ordered for CXR, pulmonary function testing and home sleep study.  ? ? ?Previous [facility] pulmonary encounter: ?01/04/2021 ?Patient presents today for an overdue follow-up.  Chest x-ray in April showed persistent right greater than left effusions.  Underwent thoracentesis of the lung on 08/04/2020. No growth on fluid culture, cytology showed reactive mesothelial cells. PFTs and home sleep test have not been completed. She subsequently cancelled/ no-showed for several appointment after this procedure. She underwent a second thoracentesis in late August/September at outside facility. She saw Dr. 08/06/2020 with [facility] on 12/10/20. Recurrent pleural effusion not felt to be coming from cardiac cause. Possibly related to pulmonary HTN. She has severe pulmonary hypertension likely from untreated sleep apnea, afib or HTN. Cardiology did not recommend heart catheterizations at this point. She is is following low salt diet ad taking lasix 40mg  daily. She is on Eliquis 5mg  twice daily for hx afib. She is doing alright today, no acute complaints. She has intermittent shortness of breath symptoms. She needed to use her Albuterol rescue inhaler yesterday. ? ?02/09/21- Dr.  ?She is here with her daughter. ? 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  She had repeat thoracentesis at [facility] in August with 700 ml fluid removed.  She was sent home with supplemental oxygen.  Uses 2 liters intermittently.  Has PFT scheduled for later this month.  Wasn't able to  schedule home sleep study yet.  Has cough with clear sputum and intermittent wheeze.   ? ?Chest xray from September showed recurrence of right effusion. ? ?04/12/2021 ?Patient presents today for 2 month follow-up with PFTs. Accompanied by her daughter.  PMH signifciant for OSA, pulmonary HTN, diastolic CHF, afib. She has symptoms of persistent dyspnea. Pulmonary function testing today showed moderate-severe restrictive lung disease without BD response. Moderate diffusion defect. Patient had a CXR in November 2022 that showed right > left pleural effusion. She has persistent pleural effusion, no cardiac interventions were planned. She has pulmonary HTN and possible cirrhosis. She was ordered for abdominal ultrasound to assess further but this was cancelled by patient and needs to be rescheduled. She was also ordered for HST which needs to be scheduled.  ? ?05/26/2021  ?Patient presents today for 4-8 week follow-up. She continues to have chronic shortness of breath symptoms. Some associated chest tightness and cough. Leg swelling is baseline, weight is stable. Taking lasix 40mg  daily. Wears 3L oxygen at bedtime. Review sleep study results, she is open to trying CPAP but unsure if she will be able to tolerate.  ? ?06/23/2021 ?Patient presents today for 1 month follow-up. During her last visit she had reported symptoms of dyspnea along with chest tightness and cough. She has moderate obstruction/restriction on pulmonary function testing without BD response. She does report improvement from SABA, using upwards for 5 times a day. She was given trial of Symbicort December 2022 to use twice daily. She also had an abdominal ultrasound in January which showed changes of her liver consistent with cirrhosis. LFTs were normal. She feels her breathing is worse.  She has seen no improvement with Symbicort. Still using albuterol frequently with relief. She has associated abdominal and lower extremity swelling. She is currently taking lasix 40mg   daily.  ? ?BNP remains elevated, have her take lasix 60mg  daily x 1 week and extra KCL when taking higher lasix dose. Needs to follow-up with cardiology  ? ?07/08/2021 - Interim hx  ?Patient presents today for 2 week follow-up. ? ? ?Echo showed severe elevated pulmonary artery systolic pressure  ?D-dimer, on eliquis ? CTA ?Sleep apnea, ordered for titration study  ?No ILD  ? ? ?Cardiac testing:  ?Echocardiogram 06/23/21 >> Normal EG 65-70%, mild LVH. Severely elevated pulmonary artery systolic pressure, estimated 76.2 mmHg. Several dilated Left and right atrium.  ? ? ? ? ? ? ?Allergies  ?Allergen Reactions  ? Codeine Hives  ?  Itching  ? Sulfa Antibiotics Hives  ? Ibuprofen Other (See Comments)  ?  States that her doctor told her not to take it because of her heart.  ? ? ?Immunization History  ?Administered Date(s) Administered  ? Influenza, High Dose Seasonal PF 01/19/2016, 03/16/2017, 05/17/2018, 01/02/2019, 12/09/2019, 12/21/2020  ? Influenza-Unspecified 01/19/2016  ? PFIZER(Purple Top)SARS-COV-2 Vaccination 08/01/2019, 08/22/2019  ? Pneumococcal Conjugate-13 01/28/2014  ? Pneumococcal Polysaccharide-23 04/07/2008, 03/16/2017  ? Tdap 11/20/2008, 09/07/2019  ? ? ?Past Medical History:  ?Diagnosis Date  ? Allergy   ? Anxiety   ? Asthma   ? Atrial fibrillation ([facility])   ? Cellulitis of right leg   ? Chronic low back pain   ? Depression   ? GERD (gastroesophageal reflux disease)   ? Gout   ? Gout   ? Hyperlipidemia   ? Hypertension   ? Hypertension   ? Ischemic cerebrovascular accident (CVA) ([facility])   ? OSA (obstructive sleep apnea)   ? Osteopenia   ? Spinal stenosis   ? Tremor   ? Vitamin D deficiency   ? ? ?Tobacco History: ?Social History  ? ?Tobacco Use  ?Smoking Status Former  ? Types: Cigarettes  ? Quit date: 10/29/1976  ? Years since quitting: 44.7  ?Smokeless Tobacco Never  ? ?Counseling given: Not Answered ? ? ?Outpatient Medications Prior to Visit  ?Medication Sig Dispense Refill  ? acetaminophen (TYLENOL)  325 MG tablet Take by mouth.    ? albuterol (PROVENTIL) (2.5 MG/3ML) 0.083% nebulizer solution Inhale into the lungs.    ? albuterol (VENTOLIN HFA) 108 (90 Base) MCG/ACT inhaler Inhale into the lungs.    ? allopurinol (ZYLOPRIM) 100 MG tablet Take 1 tablet by mouth daily.    ? ALPRAZolam (XANAX) 0.25 MG tablet Take by mouth.    ? apixaban (ELIQUIS) 5 MG TABS tablet TAKE 1 TABLET(5 MG) BY MOUTH TWICE DAILY    ? atorvastatin (LIPITOR) 10 MG tablet Take by mouth.    ? benzonatate (TESSALON) 100 MG capsule Take by mouth.    ? budesonide-formoterol (SYMBICORT) 80-4.5 MCG/ACT inhaler Inhale 2 puffs into the lungs in the morning and at bedtime. 1 each 1  ? colchicine 0.6 MG tablet Take 1 tablet (0.6 mg total) by mouth daily. 30 tablet 0  ? diltiazem (TIAZAC) 240 MG 24 hr capsule Take 240 mg by mouth daily.    ? DULoxetine (CYMBALTA) 30 MG capsule Take 1 capsule by mouth daily.    ? famotidine (PEPCID) 20 MG tablet Take by mouth.    ? gabapentin (NEURONTIN) 100 MG capsule Take by mouth.    ? levofloxacin (LEVAQUIN) 500 MG tablet Take 1 tablet (500 mg  total) by mouth daily. 7 tablet 0  ? magnesium oxide (MAG-OX) 400 MG tablet Take 1 tablet by mouth 2 (two) times daily.    ? metoprolol tartrate (LOPRESSOR) 25 MG tablet Take 25 mg by mouth 2 (two) times daily.    ? Nebulizers MISC Use every 4-6 hours as needed    ? potassium chloride (KLOR-CON M) 10 MEQ tablet Take 1 tablet (10 mEq total) by mouth daily. 30 tablet 0  ? Vitamin D, Ergocalciferol, (DRISDOL) 1.25 MG (50000 UNIT) CAPS capsule Take 1 capsule by mouth 2 (two) times a week.    ? ?No facility-administered medications prior to visit.  ? ? ? ? ?Review of Systems ? ?Review of Systems ? ? ?Physical Exam ? ?There were no vitals taken for this visit. ?Physical Exam  ? ?Lab Results: ? ?CBC ?   ?Component Value Date/Time  ? WBC 6.8 10/23/2020 1525  ? RBC 5.43 (H) 10/23/2020 1525  ? HGB 12.9 10/23/2020 1525  ? HCT 42.8 10/23/2020 1525  ? PLT 243 10/23/2020 1525  ? MCV 78.8 (L)  10/23/2020 1525  ? MCH 23.8 (L) 10/23/2020 1525  ? MCHC 30.1 10/23/2020 1525  ? RDW 15.8 (H) 10/23/2020 1525  ? LYMPHSABS 1.7 10/23/2020 1525  ? MONOABS 1.0 10/23/2020 1525  ? EOSABS 0.3 10/23/2020 152

## 2021-08-08 ENCOUNTER — Encounter: Payer: Self-pay | Admitting: Primary Care

## 2021-08-09 ENCOUNTER — Inpatient Hospital Stay (HOSPITAL_COMMUNITY)
Admission: RE | Admit: 2021-08-09 | Discharge: 2021-08-09 | Disposition: A | Discharge status: Home / Self Care | Payer: Medicare PPO | Source: Ambulatory Visit | Attending: Internal Medicine | Admitting: Internal Medicine

## 2021-08-09 NOTE — Progress Notes (Signed)
? ? ?Patient did not show for appointment. Note left for templating purposes only.  ? ? ?ADVANCED HF CLINIC CONSULT NOTE ? ?Referring Physician: Geraldo Pitter NP ?Primary Care: Deborah Chalk, FNP ?Primary Cardiologist: New ? ?HPI: ? ?[name] is a 76 year old female, former smoker quit 1978 with morbid obesity s/p lap band, diastolic HF, OSA, previous [facility] recurrent pleural effusion  referred by Geraldo Pitter for further evaluation of pulmonary HTN ? ? ? ?Review of Systems: [y] = yes, [ ]  = no  ? ?General: Weight gain [ ] ; Weight loss [ ] ; Anorexia [ ] ; Fatigue [ ] ; Fever [ ] ; Chills [ ] ; Weakness [ ]   ?Cardiac: Chest pain/pressure [ ] ; Resting SOB [ ] ; Exertional SOB [ ] ; Orthopnea [ ] ; Pedal Edema [ ] ; Palpitations [ ] ; Syncope [ ] ; Presyncope [ ] ; Paroxysmal nocturnal dyspnea[ ]   ?Pulmonary: Cough [ ] ; Wheezing[ ] ; Hemoptysis[ ] ; Sputum [ ] ; Snoring [ ]   ?GI: Vomiting[ ] ; Dysphagia[ ] ; Melena[ ] ; Hematochezia [ ] ; Heartburn[ ] ; Abdominal pain [ ] ; Constipation [ ] ; Diarrhea [ ] ; BRBPR [ ]   ?GU: Hematuria[ ] ; Dysuria [ ] ; Nocturia[ ]   ?Vascular: Pain in legs with walking [ ] ; Pain in feet with lying flat [ ] ; Non-healing sores [ ] ; Stroke [ ] ; TIA [ ] ; Slurred speech [ ] ;  ?Neuro: Headaches[ ] ; Vertigo[ ] ; Seizures[ ] ; Paresthesias[ ] ;Blurred vision [ ] ; Diplopia [ ] ; Vision changes [ ]   ?Ortho/Skin: Arthritis [ ] ; Joint pain [ ] ; Muscle pain [ ] ; Joint swelling [ ] ; Back Pain [ ] ; Rash [ ]   ?Psych: Depression[ ] ; Anxiety[ ]   ?Heme: Bleeding problems [ ] ; Clotting disorders [ ] ; Anemia [ ]   ?Endocrine: Diabetes [ ] ; Thyroid dysfunction[ ]  ? ? ?Past Medical History:  ?Diagnosis Date  ? Allergy   ? Anxiety   ? Asthma   ? Atrial fibrillation ([place])   ? Cellulitis of right leg   ? Chronic low back pain   ? Depression   ? GERD (gastroesophageal reflux disease)   ? Gout   ? Gout   ? Hyperlipidemia   ? Hypertension   ? Hypertension   ? Ischemic cerebrovascular accident (CVA) ([place])   ? OSA (obstructive sleep apnea)    ? Osteopenia   ? Spinal stenosis   ? Tremor   ? Vitamin D deficiency   ? ? ?Current Outpatient Medications  ?Medication Sig Dispense Refill  ? acetaminophen (TYLENOL) 325 MG tablet Take by mouth.    ? albuterol (PROVENTIL) (2.5 MG/3ML) 0.083% nebulizer solution Inhale into the lungs.    ? albuterol (VENTOLIN HFA) 108 (90 Base) MCG/ACT inhaler Inhale into the lungs.    ? allopurinol (ZYLOPRIM) 100 MG tablet Take 1 tablet by mouth daily.    ? ALPRAZolam (XANAX) 0.25 MG tablet Take by mouth.    ? apixaban (ELIQUIS) 5 MG TABS tablet TAKE 1 TABLET(5 MG) BY MOUTH TWICE DAILY    ? atorvastatin (LIPITOR) 10 MG tablet Take by mouth.    ? benzonatate (TESSALON) 100 MG capsule Take by mouth.    ? budesonide-formoterol (SYMBICORT) 80-4.5 MCG/ACT inhaler Inhale 2 puffs into the lungs in the morning and at bedtime. 1 each 1  ? colchicine 0.6 MG tablet Take 1 tablet (0.6 mg total) by mouth daily. 30 tablet 0  ? diltiazem (TIAZAC) 240 MG 24 hr capsule Take 240 mg by mouth daily.    ? DULoxetine (CYMBALTA) 30 MG capsule Take 1 capsule by mouth daily.    ?  famotidine (PEPCID) 20 MG tablet Take by mouth.    ? gabapentin (NEURONTIN) 100 MG capsule Take by mouth.    ? levofloxacin (LEVAQUIN) 500 MG tablet Take 1 tablet (500 mg total) by mouth daily. 7 tablet 0  ? magnesium oxide (MAG-OX) 400 MG tablet Take 1 tablet by mouth 2 (two) times daily.    ? metoprolol tartrate (LOPRESSOR) 25 MG tablet Take 25 mg by mouth 2 (two) times daily.    ? Nebulizers MISC Use every 4-6 hours as needed    ? potassium chloride (KLOR-CON M) 10 MEQ tablet Take 1 tablet (10 mEq total) by mouth daily. 30 tablet 0  ? Vitamin D, Ergocalciferol, (DRISDOL) 1.25 MG (50000 UNIT) CAPS capsule Take 1 capsule by mouth 2 (two) times a week.    ? ?No current facility-administered medications for this encounter.  ? ? ?Allergies  ?Allergen Reactions  ? Codeine Hives  ?  Itching  ? Sulfa Antibiotics Hives  ? Ibuprofen Other (See Comments)  ?  States that her doctor told  her not to take it because of her heart.  ? ? ?  ?Social History  ? ?Socioeconomic History  ? Marital status: Married  ?  Spouse name: Not on file  ? Number of children: Not on file  ? Years of education: Not on file  ? Highest education level: Not on file  ?Occupational History  ? Not on file  ?Tobacco Use  ? Smoking status: Former  ?  Types: Cigarettes  ?  Quit date: 10/29/1976  ?  Years since quitting: 44.8  ? Smokeless tobacco: Never  ?Vaping Use  ? Vaping Use: Never used  ?Substance and Sexual Activity  ? Alcohol use: No  ? Drug use: No  ? Sexual activity: Not on file  ?Other Topics Concern  ? Not on file  ?Social History Narrative  ? Not on file  ? ?Social Determinants of Health  ? ?Financial Resource Strain: Not on file  ?Food Insecurity: Not on file  ?Transportation Needs: Not on file  ?Physical Activity: Not on file  ?Stress: Not on file  ?Social Connections: Not on file  ?Intimate Partner Violence: Not on file  ? ? ?  ?Family History  ?Problem Relation Age of Onset  ? Heart disease Mother   ? Heart disease Father   ? ? ?There were no vitals filed for this visit. ? ?PHYSICAL EXAM: ?General:  Well appearing. No respiratory difficulty ?HEENT: normal ?Neck: supple. no JVD. Carotids 2+ bilat; no bruits. No lymphadenopathy or thryomegaly appreciated. ?Cor: PMI nondisplaced. Regular rate & rhythm. No rubs, gallops or murmurs. ?Lungs: clear ?Abdomen: soft, nontender, nondistended. No hepatosplenomegaly. No bruits or masses. Good bowel sounds. ?Extremities: no cyanosis, clubbing, rash, edema ?Neuro: alert & oriented x 3, cranial nerves grossly intact. moves all 4 extremities w/o difficulty. Affect pleasant. ? ?ECG: ? ? ?ASSESSMENT & PLAN:  ?

## 2021-08-12 ENCOUNTER — Ambulatory Visit: Payer: Medicare PPO | Admitting: Pulmonary Disease

## 2021-08-12 ENCOUNTER — Ambulatory Visit (INDEPENDENT_AMBULATORY_CARE_PROVIDER_SITE_OTHER): Payer: Medicare PPO

## 2021-08-12 ENCOUNTER — Encounter: Payer: Self-pay | Admitting: Pulmonary Disease

## 2021-08-12 VITALS — BP 120/72 | HR 82 | Ht 62.0 in

## 2021-08-12 DIAGNOSIS — J90 Pleural effusion, not elsewhere classified: Secondary | ICD-10-CM | POA: Diagnosis not present

## 2021-08-12 NOTE — Patient Instructions (Addendum)
Call to schedule your appointment with [facility] ? ?Keep your appointment with Dr. Gala Romney with cardiology heart failure team ? ?Will make sure your CPAP titration study gets scheduled ? ?Chest xray today - will call with results and let you know if you need fluid drained again ? ?Follow up in 3 months ?

## 2021-08-12 NOTE — Progress Notes (Signed)
? ?[name] ? ?Chief Complaint  ?Patient presents with  ? Follow-up  ?  2 mo f/u for DOE and sleep apnea. Having to use her O2 more at 3L. Still has a productive cough.    ? ? ?Constitutional:  ?BP 120/72   Pulse 82   Ht 5\' 2"  (1.575 m)   SpO2 98% Comment: on RA  BMI 40.24 kg/m?  ? ?Past Medical History:  ?GERD, Anxiety, DM type 2, Permanent atrial fibrillation, Ischemic CVA, Asthma, HLD, HTN, s/p gastric band, OSA, Allergic rhinitis, Chronic back pain with spinal stenosis, Osteopenia, Depression, Vit D deficiency, Gout, Rt leg cellulitis, Tremor, Cirrhosis ? ?Past Surgical History:  ?She  has a past surgical history that includes Colostomy; Laparoscopic cholecystectomy; Upper gi endoscopy; and Laparoscopic gastric banding. ? ?Brief Summary:  ?Annette Moore is a 76 y.o. female former smoker with obstructive sleep apnea, dyspnea, and pulmonary hypertension. ?  ? ? ? ?Subjective:  ? ?She is here with an Environmental consultant. ? ?She feels more short of breath and has leg swelling.  Feels better when she increases her lasix.  Has some cough and congestion.  Uses oxygen at 3 liters.   ? ?Physical Exam:  ? ?Appearance - well kempt, sitting in wheelchair ? ?ENMT - no sinus tenderness, no oral exudate, no LAN, Mallampati 3 airway, no stridor ? ?Respiratory - b/l rhonchi that clear with cough ? ?CV - s1s2 regular rate and rhythm, no murmurs ? ?Ext -1+ edema ? ?Skin - no rashes ? ?Psych - normal mood and affect ? ?  ?Pulmonary testing:  ?Rt thoracentesis 12/04/16 >> 700 ml fluid, glucose 135, LDH 327, 486 WBC (58%M, 26%L, 9%N), cytology negative ?Rt thoracentesis 08/04/20 >> glucose 105, protein < 3, LDH 65, cell count specimen clotted, reactive mesothelial cells ? ?Chest Imaging:  ?CT angio chest 04/16/20 >> main PA 3.7 cm, moderate Rt pleural effusion ?HRCT chest 04/27/21 >> partially loculated Rt effusion with ATX of RML and RLL, changes of cirrhosis ? ?Sleep Tests:  ?HST 05/03/21 >>  AHI 63.1, SpO2 low 58%.  Spent 178.6 min with SpO2 < 89%. ? ?Cardiac Tests:  ?Doppler legs b/l 04/21/20 >> no DVT ?Echo 06/23/21 >> EF 65 to 70%, mild LVH, RVSP 76.2 mmHg, severe LA/RA dilation, mild MR ? ?Social History:  ?She  reports that she quit smoking about 44 years ago. Her smoking use included cigarettes. She has never used smokeless tobacco. She reports that she does not drink alcohol and does not use drugs. ? ?Family History:  ?Her family history includes Heart disease in her father and mother. ?  ? ? ?Assessment/Plan:  ? ?Obstructive sleep apnea. ?- will make sure she has CPAP titration study set up ? ?History of asthma and tobacco abuse. ?- she has PFT scheduled for 03/09/21 ?- continue prn albuterol pending PFT results ? ?Recurrent right pleural effusion. ?- likely in setting of cirrhosis and CHF ?- previous fluid analysis consistent with transudate ?- repeat chest xray today and then determine if she needs repeat therapeutic thoracentesis; would need to hold eliquis for procedure ? ?Chronic hypoxic respiratory failure. ?- she is using 3 liters with exertion and sleep ? ?Chronic diastolic CHF, permanent atrial fibrillation, pulmonary hypertension. ?- seen previously by Dr. Bishop Limbo with cardiology at [facility] ?- she plans to have follow up with [facility] heart failure team ? ?Dysphagia, GERD, cirrhosis. ?- seen previously Dr. Maretta Los with gastroenterology at [facility] ?- contact information provided so she can  scheduled appointment with [facility] ? ?Time Spent Involved in Patient Care on Day of Examination:  ?38 minutes ? ?Follow up:  ? ?Patient Instructions  ?Call to schedule your appointment with [facility] ? ?Keep your appointment with Dr. Haroldine Laws with cardiology heart failure team ? ?Will make sure your CPAP titration study gets scheduled ? ?Chest xray today - will call with results and let you know if you need fluid drained again ? ?Follow up in 3 months ? ?Medication List:   ? ?Allergies as of 08/12/2021   ? ?   Reactions  ? Codeine Hives  ? Itching  ? Sulfa Antibiotics Hives  ? Ibuprofen Other (See Comments)  ? States that her doctor told her not to take it because of her heart.  ? ?  ? ?  ?Medication List  ?  ? ?  ? Accurate as of Aug 12, 2021 12:46 PM. If you have any questions, ask your nurse or doctor.  ?  ?  ? ?  ? ?STOP taking these medications   ? ?levofloxacin 500 MG tablet ?Commonly known as: LEVAQUIN ?Stopped by: Chesley Mires, MD ?  ? ?  ? ?TAKE these medications   ? ?acetaminophen 325 MG tablet ?Commonly known as: TYLENOL ?Take by mouth. ?  ?albuterol (2.5 MG/3ML) 0.083% nebulizer solution ?Commonly known as: PROVENTIL ?Inhale into the lungs. ?  ?albuterol 108 (90 Base) MCG/ACT inhaler ?Commonly known as: VENTOLIN HFA ?Inhale into the lungs. ?  ?allopurinol 100 MG tablet ?Commonly known as: ZYLOPRIM ?Take 1 tablet by mouth daily. ?  ?ALPRAZolam 0.25 MG tablet ?Commonly known as: Duanne Moron ?Take by mouth. ?  ?atorvastatin 10 MG tablet ?Commonly known as: LIPITOR ?Take by mouth. ?  ?benzonatate 100 MG capsule ?Commonly known as: TESSALON ?Take by mouth. ?  ?budesonide-formoterol 80-4.5 MCG/ACT inhaler ?Commonly known as: Symbicort ?Inhale 2 puffs into the lungs in the morning and at bedtime. ?  ?colchicine 0.6 MG tablet ?Take 1 tablet (0.6 mg total) by mouth daily. ?  ?diltiazem 240 MG 24 hr capsule ?Commonly known as: TIAZAC ?Take 240 mg by mouth daily. ?  ?DULoxetine 30 MG capsule ?Commonly known as: CYMBALTA ?Take 1 capsule by mouth daily. ?  ?Eliquis 5 MG Tabs tablet ?Generic drug: apixaban ?TAKE 1 TABLET(5 MG) BY MOUTH TWICE DAILY ?  ?famotidine 20 MG tablet ?Commonly known as: PEPCID ?Take by mouth. ?  ?gabapentin 100 MG capsule ?Commonly known as: NEURONTIN ?Take by mouth. ?  ?magnesium oxide 400 MG tablet ?Commonly known as: MAG-OX ?Take 1 tablet by mouth 2 (two) times daily. ?  ?metoprolol tartrate 25 MG tablet ?Commonly known as: LOPRESSOR ?Take 25 mg by mouth 2 (two) times  daily. ?  ?Nebulizers Misc ?Use every 4-6 hours as needed ?  ?potassium chloride 10 MEQ tablet ?Commonly known as: KLOR-CON M ?Take 1 tablet (10 mEq total) by mouth daily. ?  ?Vitamin D (Ergocalciferol) 1.25 MG (50000 UNIT) Caps capsule ?Commonly known as: DRISDOL ?Take 1 capsule by mouth 2 (two) times a week. ?  ? ?  ? ? ?Signature:  ?Chesley Mires, MD ?[facility] ?Pager - [phone] - [phone] ?08/12/2021, 12:46 PM ?  ? ? ? ? ? ? ? ? ?

## 2021-08-15 ENCOUNTER — Encounter: Payer: Self-pay | Admitting: Gastroenterology

## 2021-08-15 ENCOUNTER — Telehealth: Payer: Self-pay | Admitting: Pulmonary Disease

## 2021-08-15 DIAGNOSIS — J90 Pleural effusion, not elsewhere classified: Secondary | ICD-10-CM

## 2021-08-15 NOTE — Telephone Encounter (Signed)
Please let her know that her chest xray shows fluid around her right lung.  I have placed an order to have her meet with interventional radiology to have fluid drained again (right thoracentesis). ?

## 2021-08-16 NOTE — Telephone Encounter (Signed)
Spoke to patients daughter Gilmore Laroche (ok per dpr) and she wants to know how much fluid is around the lung and how concerning this is for patient.  ?She is also concerned about how soon this needs to be done.  ?Dr. Craige Cotta please advise  ?

## 2021-08-17 ENCOUNTER — Other Ambulatory Visit (INDEPENDENT_AMBULATORY_CARE_PROVIDER_SITE_OTHER): Payer: Medicare PPO

## 2021-08-17 ENCOUNTER — Encounter: Payer: Self-pay | Admitting: Gastroenterology

## 2021-08-17 ENCOUNTER — Ambulatory Visit (INDEPENDENT_AMBULATORY_CARE_PROVIDER_SITE_OTHER): Payer: Medicare PPO | Admitting: Gastroenterology

## 2021-08-17 VITALS — BP 124/60 | HR 67

## 2021-08-17 DIAGNOSIS — J90 Pleural effusion, not elsewhere classified: Secondary | ICD-10-CM

## 2021-08-17 DIAGNOSIS — K219 Gastro-esophageal reflux disease without esophagitis: Secondary | ICD-10-CM

## 2021-08-17 DIAGNOSIS — R110 Nausea: Secondary | ICD-10-CM | POA: Diagnosis not present

## 2021-08-17 DIAGNOSIS — R932 Abnormal findings on diagnostic imaging of liver and biliary tract: Secondary | ICD-10-CM

## 2021-08-17 LAB — CBC WITH DIFFERENTIAL/PLATELET
Basophils Absolute: 0 10*3/uL (ref 0.0–0.1)
Basophils Relative: 0.6 % (ref 0.0–3.0)
Eosinophils Absolute: 0.2 10*3/uL (ref 0.0–0.7)
Eosinophils Relative: 4.5 % (ref 0.0–5.0)
HCT: 37.7 % (ref 36.0–46.0)
Hemoglobin: 11.8 g/dL — ABNORMAL LOW (ref 12.0–15.0)
Lymphocytes Relative: 28.7 % (ref 12.0–46.0)
Lymphs Abs: 1.5 10*3/uL (ref 0.7–4.0)
MCHC: 31.3 g/dL (ref 30.0–36.0)
MCV: 75.8 fl — ABNORMAL LOW (ref 78.0–100.0)
Monocytes Absolute: 0.7 10*3/uL (ref 0.1–1.0)
Monocytes Relative: 14.2 % — ABNORMAL HIGH (ref 3.0–12.0)
Neutro Abs: 2.7 10*3/uL (ref 1.4–7.7)
Neutrophils Relative %: 52 % (ref 43.0–77.0)
Platelets: 240 10*3/uL (ref 150.0–400.0)
RBC: 4.98 Mil/uL (ref 3.87–5.11)
RDW: 16.3 % — ABNORMAL HIGH (ref 11.5–15.5)
WBC: 5.1 10*3/uL (ref 4.0–10.5)

## 2021-08-17 MED ORDER — FAMOTIDINE 20 MG PO TABS: ORAL_TABLET | ORAL | 5 refills | Status: DC

## 2021-08-17 MED ORDER — ONDANSETRON 4 MG PO TBDP: 4.0000 mg | ORAL_TABLET | Freq: Four times a day (QID) | ORAL | 1 refills | Status: AC | PRN

## 2021-08-17 NOTE — Telephone Encounter (Signed)
Spoke with pt's daughter.  Explained that she has cardiac and liver disease and these are likely contributing to pleural effusion.  Explained that thoracentesis is mostly for symptom relief but will send for repeat fluid analysis.  She had appointment with GI today and will reschedule appointment with CHF team. ?

## 2021-08-17 NOTE — Progress Notes (Signed)
? ?HPI :  ?76 year old female with multiple medical problems to include pulmonary hypertension, OSA on CPAP, supplemental oxygen use, history of stroke, atrial fibrillation on Eliquis, right-sided pleural effusion, reported cirrhosis, here to establish care for possible cirrhosis and further evaluate symptoms of reported dysphagia, referred by Dr. Halford Chessman. ? ?He is accompanied by her son today.  I reviewed her history with her, he helps provide some history.  In looking through her chart it appears that she had suspected cirrhosis of her liver based on imaging in 2018 with CT scan.  In 2022 I see a right upper quadrant ultrasound that suggest cirrhosis of the liver.  They agree around 2018 she told she may have cirrhosis when she was admitted to the hospital for work-up of another issue.  She denies any hall.  She denies any family history of liver disease.  She has never had any decompensations from cirrhosis to include jaundice, ascites, variceal bleeding, hepatic encephalopathy.  She is uncertain if she is ever had a work-up for causes of cirrhosis, no one has told her potential etiologies.  She has had a few EGDs in the past, last in 2018 without varices.  Interestingly her most recent imaging is a CT scan from April 2022 which shows that her liver and spleen are normal.  She also had an ultrasound of her abdomen in January of this year which showed no evidence of cirrhosis overtly.  Her platelets have been normal.  Her liver enzymes are normal. ? ?She has been under the evaluation of Dr. Halford Chessman for a right-sided pleural effusion recently, question etiology, transudative, thought potentially related to her CHF/pulmonary hypertension, or cirrhosis.  She is wheelchair-bound for the most part.  Uses supplemental oxygen on a routine basis.  Her stroke was in August 2022, previously was on Coumadin when that occurred and switch to Eliquis. ? ?There is reported the patient having dysphagia.  She states she more has  indigestion that she describes as regurgitation with occasional heartburn.  She also has occasional nausea that bothers her but no vomiting.  She really denies any dysphagia at baseline that bothers her.  She is not taking any antacids for this.  Daughter states she has been on Nexium in the past which seem to provide some benefit for her.  She has a history of a gastric lap band in 2008.  She had a history of barium swallow in August 2021 reviewed in care everywhere, showing dysmotility.  Again last EGD was in 2018 as below.  Her last colonoscopy was in 2018 as below, no polyps reported.  She has a history of TVA removed in 2007. ? ?Of note when I asked her who she is seen for her cardiologist she states she does not know.  Looks like she no showed an appointment with Dr. Haroldine Laws on May 2.. ? ?Prior workup: ?Echo 06/23/21 - EF 65-70%, severe pulm HTN ? ?US abdomen complete 04/27/21 - IMPRESSION: ?1. Increased echogenicity of the liver parenchyma with no focal mass ?identified. ?2. Right pleural effusion ? ? ?CT C/A/P - 08/05/20 - ?IMPRESSION:  ?1. No acute posttraumatic changes demonstrated in the chest,  ?abdomen, or pelvis.  ?2. Cardiac enlargement with evidence of right heart failure.  ?3. Small right pleural effusion with basilar atelectasis or  ?infiltration, greater on the right. This may indicate pneumonia.  ?4. No evidence of solid organ injury or bowel perforation.  ?5. Mild mesenteric edema and scattered mesenteric lymph nodes  ?without pathologic enlargement, possibly mesenteric  adenitis.  ?6. Gastric lap band is present at the GE junction.  ?7. Small left inguinal hernia containing fat.  ?8. Aortic atherosclerosis.  ? ?NORMAL LIVER AND SPLEEN ? ?[place] 04/16/20: ?Gallbladder sludge, cirrhosis, small effusion ? ? ? ?EGD/ Colonoscopy on 05/2016 by [facility]: showed normal esophagus, a likely slipped lap band, several 40mm gastric nodules. Normal duodenum. Mild diverticulosis in right and left colon.  ? ?EGG/Colon 09/2005  done by [facility]: for dysphagia and screening: shows mildly narrowed GEJ status post-dilation. Mild gastritis. Normal duodenum. Right-sided diverticulosis. 1.2cm pedunculated polyp was removed. Biopsy showed tubulovillous adenoma. Clo test was negative for H. Pylori.  ? ?EGD 11/2002 by [facility]: Possible Barrett's esophagous. Esophageal biopsies negative for Barrett's. Questionable distal esophageal stricture. Gastritis. H. Pylori negative.   ? ? ?Past Medical History:  ?Diagnosis Date  ? Allergy   ? Anxiety   ? Asthma   ? Atrial fibrillation ([place]) 11/2014  ? chronic  ? Cellulitis of right leg   ? CHF (congestive heart failure) ([place])   ? Chronic low back pain   ? Cirrhosis of liver ([place])   ? CMC arthritis   ? Depression   ? GERD (gastroesophageal reflux disease)   ? Gout   ? History of CVA (cerebrovascular accident)   ? hx of ischemic stroke  ? History of laparoscopic adjustable gastric banding   ? Hyperlipidemia   ? Hypertension   ? Hypertension   ? Ischemic cerebrovascular accident (CVA) ([place])   ? Nonrheumatic tricuspid valve regurgitation   ? moderate -severe ECHO 11-2019  ? OSA (obstructive sleep apnea)   ? Osteopenia   ? Panic disorder   ? Pulmonary hypertension ([place])   ? RBC microcytosis   ? Sensorineural hearing loss (SNHL), bilateral   ? Spinal stenosis   ? lumbar region  ? Tremor   ? Type 2 diabetes mellitus (H[facility])   ? w/ microalbuminuria without long term use of insulin  ? Vitamin D deficiency   ? ? ? ?Past Surgical History:  ?Procedure Laterality Date  ? CARDIAC CATHETERIZATION  11/2016  ? COLOSTOMY    ? LAPAROSCOPIC CHOLECYSTECTOMY  04/2020  ? w lysis of adhesions  ? LAPAROSCOPIC GASTRIC BANDING    ? THORACENTESIS Right 11/2020  ? 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 UPPER GI ENDOSCOPY    ? ?Family History  ?Problem Relation Age of Onset  ? Heart disease Mother   ? Hypertension Mother   ? Seizures Mother   ? Heart disease Father   ? Hypertension Father   ? Seizures Sister   ? Seizures Maternal Grandmother   ? ?Social  History  ? ?Tobacco Use  ? Smoking status: Former  ?  Types: Cigarettes  ?  Quit date: 10/29/1976  ?  Years since quitting: 44.8  ? Smokeless tobacco: Never  ?Vaping Use  ? Vaping Use: Never used  ?Substance Use Topics  ? Alcohol use: No  ? Drug use: No  ? ?Current Outpatient Medications  ?Medication Sig Dispense Refill  ? acetaminophen (TYLENOL) 325 MG tablet Take by mouth.    ? albuterol (PROVENTIL) (2.5 MG/3ML) 0.083% nebulizer solution Inhale into the lungs.    ? albuterol (VENTOLIN HFA) 108 (90 Base) MCG/ACT inhaler Inhale into the lungs.    ? ALPRAZolam (XANAX) 0.25 MG tablet Take by mouth.    ? apixaban (ELIQUIS) 5 MG TABS tablet TAKE 1 TABLET(5 MG) BY MOUTH TWICE DAILY    ? benzonatate (TESSALON) 100 MG capsule Take by mouth.    ?  budesonide-formoterol (SYMBICORT) 80-4.5 MCG/ACT inhaler Inhale 2 puffs into the lungs in the morning and at bedtime. 1 each 1  ? colchicine 0.6 MG tablet Take 1 tablet (0.6 mg total) by mouth daily. 30 tablet 0  ? diltiazem (TIAZAC) 240 MG 24 hr capsule Take 240 mg by mouth daily.    ? furosemide (LASIX) 40 MG tablet Take 40 mg by mouth daily.    ? gabapentin (NEURONTIN) 100 MG capsule Take by mouth.    ? magnesium oxide (MAG-OX) 400 MG tablet Take 1 tablet by mouth 2 (two) times daily.    ? metoprolol tartrate (LOPRESSOR) 25 MG tablet Take 25 mg by mouth 2 (two) times daily.    ? Nebulizers MISC Use every 4-6 hours as needed    ? OXYGEN Inhale 3 L into the lungs as needed.    ? potassium chloride (KLOR-CON M) 10 MEQ tablet Take 1 tablet (10 mEq total) by mouth daily. 30 tablet 0  ? Vitamin D, Ergocalciferol, (DRISDOL) 1.25 MG (50000 UNIT) CAPS capsule Take 1 capsule by mouth 2 (two) times a week.    ? ?No current facility-administered medications for this visit.  ? ?Allergies  ?Allergen Reactions  ? Codeine Hives  ?  Itching  ? Sulfa Antibiotics Hives  ? Amlodipine Nausea Only  ? Ibuprofen Other (See Comments)  ?  States that her doctor told her not to take it because of her  heart.  ? Oxycodone Palpitations  ? ? ? ?Review of Systems: ?All systems reviewed and negative except where noted in HPI.  ? ? ?DG Chest 2 View ? ?Result Date: 08/13/2021 ?CLINICAL DATA:  Recurrent right pleural eff

## 2021-08-17 NOTE — Patient Instructions (Addendum)
If you are age 74 or older, your body mass index should be between 23-30. Your There is no height or weight on file to calculate BMI. If this is out of the aforementioned range listed, please consider follow up with your Primary Care Provider. ? ?If you are age 67 or younger, your body mass index should be between 19-25. Your There is no height or weight on file to calculate BMI. If this is out of the aformentioned range listed, please consider follow up with your Primary Care Provider.  ? ?________________________________________________________ ? ?The [facility] providers would like to encourage you to use [facility] to communicate with providers for non-urgent requests or questions.  Due to long hold times on the telephone, sending your provider a message by [facility] may be a faster and more efficient way to get a response.  Please allow 48 business hours for a response.  Please remember that this is for non-urgent requests.  ?_______________________________________________________ ? ?Please go to the lab in the basement of our building to have lab work done as you leave today. Hit "B" for basement when you get on the elevator.  When the doors open the lab is on your left.  We will call you with the results. Thank you. ? ?You have been scheduled for an abdominal ultrasound at [facility] (1st floor of hospital) on Thursday, 08-25-21 at 10:00am. Please arrive 30 minutes prior to your appointment for registration. Make certain not to have anything to eat or drink 6 hours prior to your appointment. Should you need to reschedule your appointment, please contact radiology at [phone]. This test typically takes about 30 minutes to perform. ? ?We have sent the following medications to your pharmacy for you to pick up at your convenience: ?Pepcid 20 mg: Take once to twice a day as needed ?Zofran 4 mg ODT: dissolve one tablet orally every 6 hours as needed for nausea ? ?Please call Bevelyn Buckles. Bensimhon, MD  (Cardiology ) at [phone] to reschedule your appointment. ? ?You have been scheduled for a follow up appointment on Thursday, 10-27-21 at 1:20 pm. ? ?Thank you for entrusting me with your care and for choosing [facility], ?Dr. Ileene Patrick ? ? ? ? ? ? ? ?

## 2021-08-19 ENCOUNTER — Ambulatory Visit (HOSPITAL_COMMUNITY)
Admission: RE | Admit: 2021-08-19 | Discharge: 2021-08-19 | Disposition: A | Discharge status: Home / Self Care | Payer: Medicare PPO | Source: Ambulatory Visit | Attending: Internal Medicine | Admitting: Internal Medicine

## 2021-08-19 ENCOUNTER — Ambulatory Visit (HOSPITAL_COMMUNITY)
Admission: RE | Admit: 2021-08-19 | Discharge: 2021-08-19 | Disposition: A | Discharge status: Home / Self Care | Payer: Medicare PPO | Source: Ambulatory Visit | Attending: Pulmonary Disease | Admitting: Pulmonary Disease

## 2021-08-19 DIAGNOSIS — J9811 Atelectasis: Secondary | ICD-10-CM | POA: Insufficient documentation

## 2021-08-19 DIAGNOSIS — I110 Hypertensive heart disease with heart failure: Secondary | ICD-10-CM | POA: Diagnosis not present

## 2021-08-19 DIAGNOSIS — Z7901 Long term (current) use of anticoagulants: Secondary | ICD-10-CM | POA: Diagnosis not present

## 2021-08-19 DIAGNOSIS — I509 Heart failure, unspecified: Secondary | ICD-10-CM | POA: Insufficient documentation

## 2021-08-19 DIAGNOSIS — I4891 Unspecified atrial fibrillation: Secondary | ICD-10-CM | POA: Insufficient documentation

## 2021-08-19 DIAGNOSIS — J90 Pleural effusion, not elsewhere classified: Secondary | ICD-10-CM | POA: Insufficient documentation

## 2021-08-19 LAB — PROTEIN, PLEURAL OR PERITONEAL FLUID: Total protein, fluid: <3 g/dL

## 2021-08-19 LAB — BODY FLUID CELL COUNT WITH DIFFERENTIAL
Eos, Fluid: 1 %
Lymphs, Fluid: 63 %
Monocyte-Macrophage-Serous Fluid: 34 % — ABNORMAL LOW (ref 50–90)
Neutrophil Count, Fluid: 2 % (ref 0–25)
Total Nucleated Cell Count, Fluid: 441 cu mm (ref 0–1000)

## 2021-08-19 LAB — GLUCOSE, PLEURAL OR PERITONEAL FLUID: Glucose, Fluid: 97 mg/dL

## 2021-08-19 LAB — GRAM STAIN

## 2021-08-19 LAB — LACTATE DEHYDROGENASE, PLEURAL OR PERITONEAL FLUID: LD, Fluid: 48 U/L — ABNORMAL HIGH (ref 3–23)

## 2021-08-19 MED ORDER — LIDOCAINE HCL 1 % IJ SOLN
INTRAMUSCULAR | Status: AC
  Administered 2021-08-19: 10 mL
  Filled 2021-08-19: qty 20

## 2021-08-19 NOTE — Procedures (Signed)
PROCEDURE SUMMARY: ? ?Successful US guided diagnostic and therapeutic right thoracentesis. ?Yielded 1 L of cloudy, amber fluid. ?Pt tolerated procedure well. ?No immediate complications. ? ?Specimen was sent for labs. ?CXR ordered. ? ?EBL < 1 mL ? ?Shon Hough, AGNP ?08/19/2021 ?4:13 PM ? ?   ?

## 2021-08-22 LAB — CYTOLOGY - NON PAP

## 2021-08-22 LAB — TRIGLYCERIDES, BODY FLUIDS: Triglycerides, Fluid: 18 mg/dL

## 2021-08-23 ENCOUNTER — Telehealth: Payer: Self-pay | Admitting: Pulmonary Disease

## 2021-08-23 LAB — CHOLESTEROL, BODY FLUID: Cholesterol, Fluid: 13 mg/dL

## 2021-08-23 NOTE — Telephone Encounter (Signed)
Rt thoracentesis 08/19/21 >> 1 liter amber fluid, glucose 97, protein < 3, LDH 48, triglyceride 18, WBC 441 (63% L, 34% M), gram stain negative, cytology benign ? ? ?D/w pt's daughter, Gilmore Laroche.  Explained fluid is still consistent with a transudate.  Explained they need to reschedule appointment with Dr. Gala Romney.  She will contact cardiology office to reschedule. ?

## 2021-08-24 LAB — CULTURE, BODY FLUID W GRAM STAIN -BOTTLE: Culture: NO GROWTH

## 2021-08-25 ENCOUNTER — Ambulatory Visit (HOSPITAL_COMMUNITY): Admission: RE | Admit: 2021-08-25 | Payer: Medicare PPO | Source: Ambulatory Visit

## 2021-10-24 ENCOUNTER — Encounter (HOSPITAL_COMMUNITY): Payer: Medicare PPO | Admitting: Internal Medicine

## 2021-10-25 ENCOUNTER — Ambulatory Visit (HOSPITAL_COMMUNITY): Admission: RE | Admit: 2021-10-25 | Payer: Medicare PPO | Source: Ambulatory Visit

## 2021-10-27 ENCOUNTER — Ambulatory Visit: Payer: Medicare PPO | Admitting: Gastroenterology

## 2021-10-27 ENCOUNTER — Telehealth: Payer: Self-pay

## 2021-10-27 NOTE — Telephone Encounter (Signed)
Patient has no showed two RUQ Ultrasound appts. Patient No-showed her clinic visit today, 10-27-21.

## 2021-11-30 ENCOUNTER — Ambulatory Visit: Payer: Medicare PPO | Admitting: Pulmonary Disease

## 2022-02-16 IMAGING — DX DG CHEST 1V
1 series · 1 of 1 positions shown · non-contrast
Comparison: 07/27/2020 chest radiograph.

CLINICAL DATA: Status post right thoracentesis

EXAM:
CHEST  1 VIEW

[chest pa]
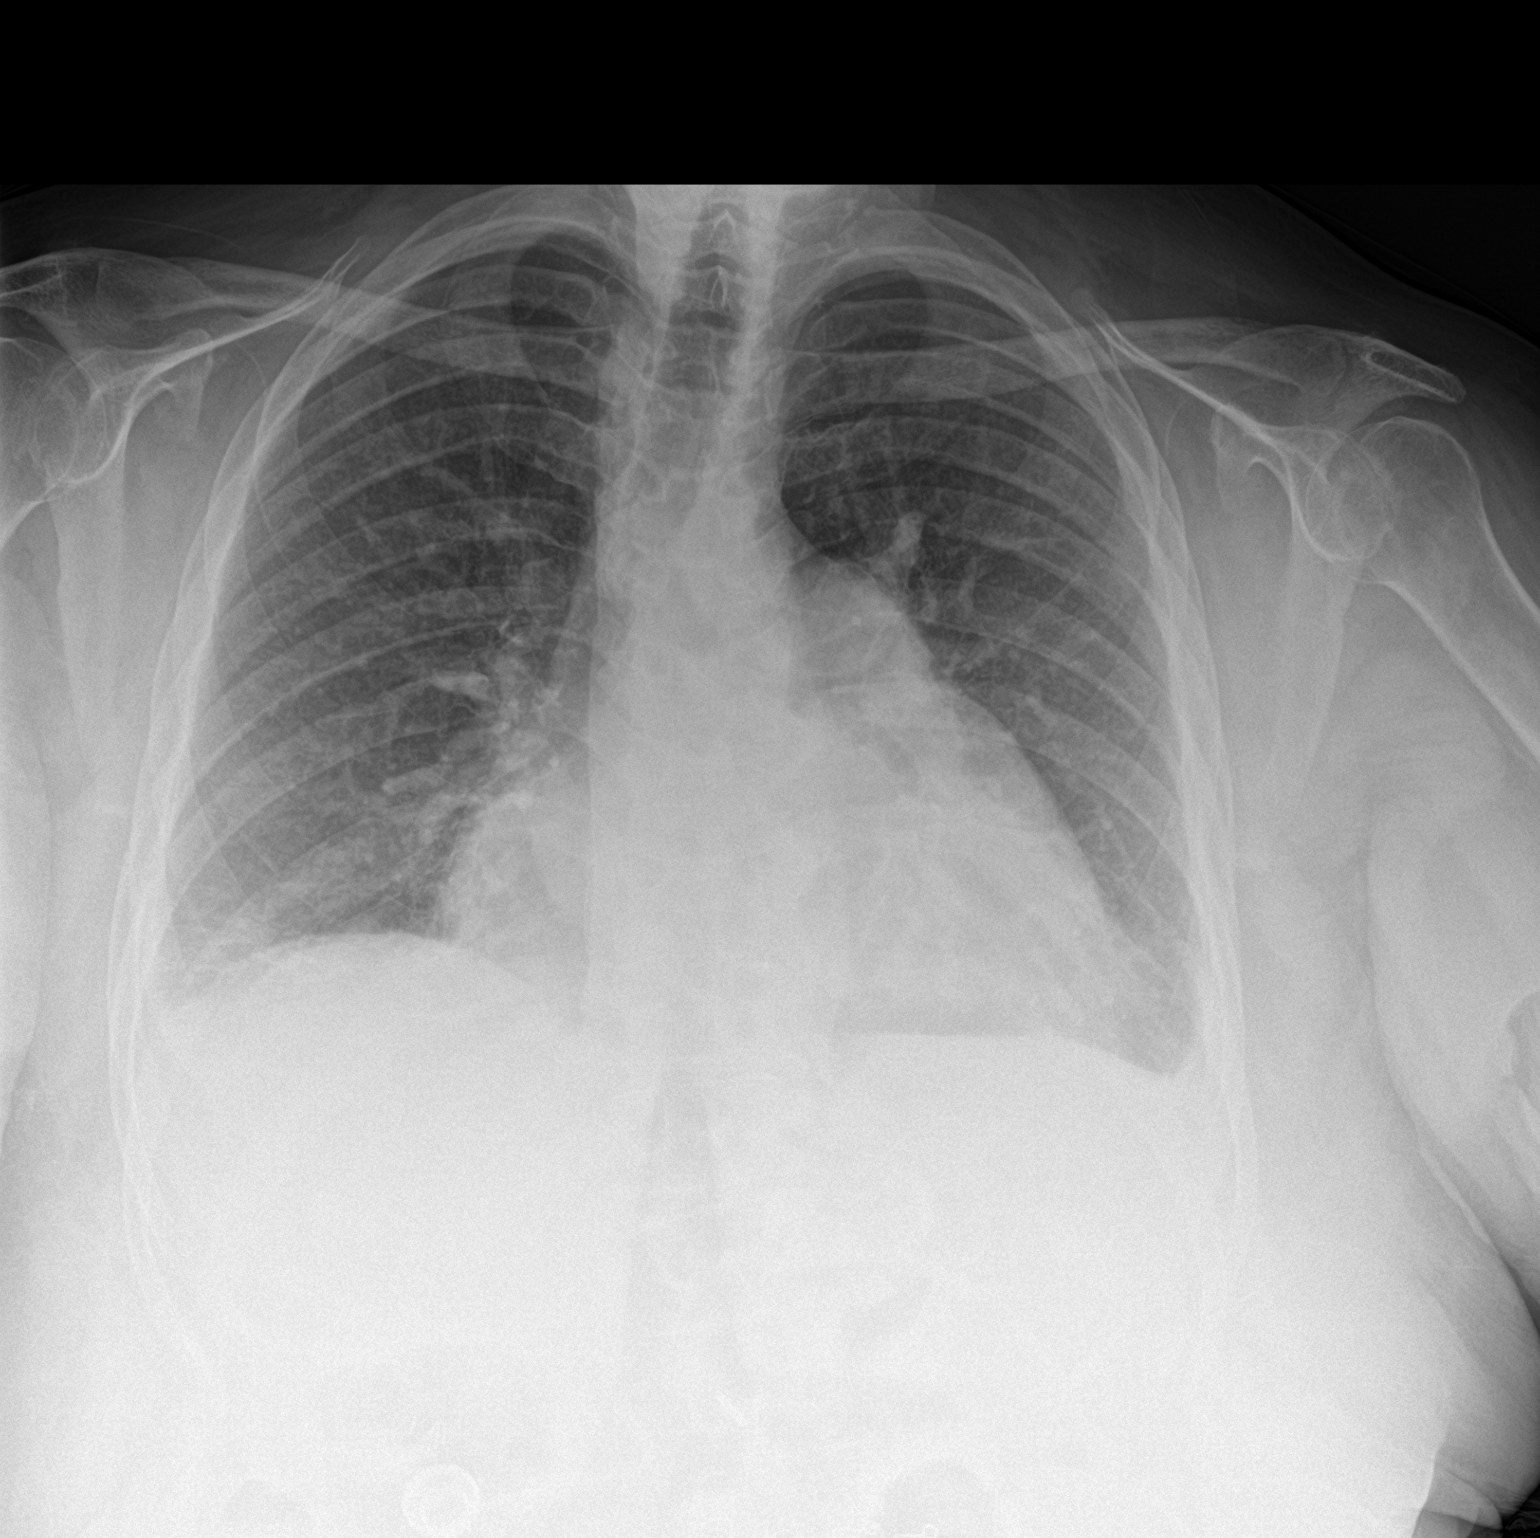

[1 of 1 positions shown; findings below may reference images not displayed]

FINDINGS: Stable cardiomediastinal silhouette with mild cardiomegaly. No
pneumothorax. Small residual right pleural effusion, significantly
decreased. Stable small left pleural effusion. No overt pulmonary
edema. Improved aeration at the lung bases with minimal residual
bibasilar atelectasis.
IMPRESSION: 1. No pneumothorax. Small residual right pleural effusion,
significantly decreased.
2. Stable small left pleural effusion.
3. Improved aeration at the lung bases with minimal residual
bibasilar atelectasis.
4. Stable cardiomegaly without overt pulmonary edema.

## 2022-04-07 ENCOUNTER — Encounter (HOSPITAL_BASED_OUTPATIENT_CLINIC_OR_DEPARTMENT_OTHER): Payer: Self-pay | Admitting: Pulmonary Disease

## 2022-04-07 ENCOUNTER — Ambulatory Visit (INDEPENDENT_AMBULATORY_CARE_PROVIDER_SITE_OTHER): Payer: Medicare PPO

## 2022-04-07 ENCOUNTER — Ambulatory Visit (INDEPENDENT_AMBULATORY_CARE_PROVIDER_SITE_OTHER): Payer: Medicare PPO | Admitting: Pulmonary Disease

## 2022-04-07 ENCOUNTER — Telehealth (HOSPITAL_BASED_OUTPATIENT_CLINIC_OR_DEPARTMENT_OTHER): Payer: Self-pay | Admitting: Pulmonary Disease

## 2022-04-07 VITALS — BP 126/72 | HR 51 | Temp 97.9°F | Ht 62.5 in | Wt 210.0 lb

## 2022-04-07 DIAGNOSIS — J45909 Unspecified asthma, uncomplicated: Secondary | ICD-10-CM

## 2022-04-07 DIAGNOSIS — J90 Pleural effusion, not elsewhere classified: Secondary | ICD-10-CM | POA: Diagnosis not present

## 2022-04-07 DIAGNOSIS — G4730 Sleep apnea, unspecified: Secondary | ICD-10-CM

## 2022-04-07 NOTE — Progress Notes (Signed)
[facility]  Chief Complaint  Patient presents with   Acute Visit    Pt states that she is using her Oxygen and inhalers more x 1 month. Pt states she is exhausted very easily.     Constitutional:  BP 126/72 (BP Location: Left Arm, Patient Position: Sitting, Cuff Size: Large)   Pulse (!) 51   Temp 97.9 F (36.6 C) (Oral)   Ht 5' 2.5" (1.588 m)   Wt 210 lb (95.3 kg)   SpO2 93%   BMI 37.80 kg/m   Past Medical History:  GERD, Anxiety, DM type 2, Permanent atrial fibrillation, Ischemic CVA, Asthma, HLD, HTN, s/p gastric band, OSA, Allergic rhinitis, Chronic back pain with spinal stenosis, Osteopenia, Depression, Vit D deficiency, Gout, Rt leg cellulitis, Tremor, Cirrhosis  Past Surgical History:  She  has a past surgical history that includes Colostomy; Laparoscopic cholecystectomy (04/2020); Upper gi endoscopy; Laparoscopic gastric banding; Cardiac catheterization (11/2016); toe amputation (Right, 01/2021); and Thoracentesis (Right, 11/2020).  Brief Summary:  Annette Moore is a 76 y.o. female former smoker with obstructive sleep apnea, dyspnea, and pulmonary hypertension.      Subjective:   She is here with an Environmental consultant.  Last thoracentesis was in May.  She was seen by GI and wasn't felt to have significant cirrhosis that would contribute to pleural effusion.    She is feeling more short of breath over the past several weeks.  Gets winded with minimal activity.  She has tried using her inhaler and this helps to some degree.  No fever.  Using oxygen more.  Physical Exam:   Appearance - well kempt, sitting in wheelchair  ENMT - no sinus tenderness, no oral exudate, no LAN, Mallampati 3 airway, no stridor  Respiratory - decreased BS at Rt base  CV - s1s2 regular rate and rhythm, no murmurs  Ext - no clubbing, no edema  Skin - no rashes  Psych - normal mood and affect     Pulmonary testing:  Rt thoracentesis 12/04/16  >> 700 ml fluid, glucose 135, LDH 327, 486 WBC (58%M, 26%L, 9%N), cytology negative Rt thoracentesis 08/04/20 >> glucose 105, protein < 3, LDH 65, cell count specimen clotted, reactive mesothelial cells Rt thoracentesis 08/19/21 >> 1 liter amber fluid, glucose 97, protein < 3, LDH 48, triglyceride 18, WBC 441 (63% L, 34% M), gram stain negative, cytology benign   Chest Imaging:  CT angio chest 04/16/20 >> main PA 3.7 cm, moderate Rt pleural effusion HRCT chest 04/27/21 >> partially loculated Rt effusion with ATX of RML and RLL, changes of cirrhosis  Sleep Tests:  HST 05/03/21 >> AHI 63.1, SpO2 low 58%.  Spent 178.6 min with SpO2 < 89%.  Cardiac Tests:  Doppler legs b/l 04/21/20 >> no DVT Echo 06/23/21 >> EF 65 to 70%, mild LVH, RVSP 76.2 mmHg, severe LA/RA dilation, mild MR  Social History:  She  reports that she quit smoking about 45 years ago. Her smoking use included cigarettes. She has never used smokeless tobacco. She reports that she does not drink alcohol and does not use drugs.  Family History:  Her family history includes Heart disease in her father and mother; Hypertension in her father and mother; Seizures in her maternal grandmother, mother, and sister.     Assessment/Plan:   Obstructive sleep apnea. - will reschedule CPAP titration study  History of asthma and tobacco abuse. - continue symbicort 80 two puffs bid - prn albuterol - demonstrated inhaler technique  Recurrent right pleural effusion. - previous fluid analysis consistent with transudate - will repeat chest xray and determine if she needs repeat thoracentesis  Chronic hypoxic respiratory failure. - she is using 3 liters with exertion and sleep  Chronic diastolic CHF, permanent atrial fibrillation, pulmonary hypertension. - seen previously by Dr. Tollie Pizza with cardiology at [facility] - she plans to have follow up with [facility] heart failure team  Dysphagia, GERD, cirrhosis. - followed by Dr. Adela Lank with  [facility]  Time Spent Involved in Patient Care on Day of Examination:  37 minutes  Follow up:   There are no Patient Instructions on file for this visit.  Medication List:   Allergies as of 04/07/2022       Reactions   Codeine Hives   Itching   Sulfa Antibiotics Hives   Amlodipine Nausea Only   Ibuprofen Other (See Comments)   States that her doctor told her not to take it because of her heart.   Oxycodone Palpitations        Medication List        Accurate as of April 07, 2022  9:53 AM. If you have any questions, ask your nurse or doctor.          STOP taking these medications    famotidine 20 MG tablet Commonly known as: Pepcid Stopped by: Coralyn Helling, MD       TAKE these medications    acetaminophen 325 MG tablet Commonly known as: TYLENOL Take by mouth.   albuterol (2.5 MG/3ML) 0.083% nebulizer solution Commonly known as: PROVENTIL Inhale into the lungs.   albuterol 108 (90 Base) MCG/ACT inhaler Commonly known as: VENTOLIN HFA Inhale into the lungs.   ALPRAZolam 0.25 MG tablet Commonly known as: XANAX Take by mouth.   benzonatate 100 MG capsule Commonly known as: TESSALON Take by mouth.   budesonide-formoterol 80-4.5 MCG/ACT inhaler Commonly known as: Symbicort Inhale 2 puffs into the lungs in the morning and at bedtime.   colchicine 0.6 MG tablet Take 1 tablet (0.6 mg total) by mouth daily.   diltiazem 240 MG 24 hr capsule Commonly known as: TIAZAC Take 240 mg by mouth daily.   Eliquis 5 MG Tabs tablet Generic drug: apixaban TAKE 1 TABLET(5 MG) BY MOUTH TWICE DAILY   furosemide 40 MG tablet Commonly known as: LASIX Take 40 mg by mouth daily.   gabapentin 100 MG capsule Commonly known as: NEURONTIN Take by mouth.   magnesium oxide 400 MG tablet Commonly known as: MAG-OX Take 1 tablet by mouth 2 (two) times daily.   metoprolol tartrate 25 MG tablet Commonly known as: LOPRESSOR Take 25 mg by mouth 2 (two) times  daily.   Nebulizers Misc Use every 4-6 hours as needed   ondansetron 4 MG disintegrating tablet Commonly known as: ZOFRAN-ODT Take 1 tablet (4 mg total) by mouth every 6 (six) hours as needed for nausea or vomiting.   OXYGEN Inhale 3 L into the lungs as needed.   potassium chloride 10 MEQ tablet Commonly known as: KLOR-CON M Take 1 tablet (10 mEq total) by mouth daily.   Vitamin D (Ergocalciferol) 1.25 MG (50000 UNIT) Caps capsule Commonly known as: DRISDOL Take 1 capsule by mouth 2 (two) times a week.        Signature:  Coralyn Helling, MD [facility] Pulmonary/Critical Care Pager - [phone] 04/07/2022, 9:53 AM

## 2022-04-07 NOTE — Patient Instructions (Signed)
Chest xray today  Symbicort two puffs twice per day, and rinse your mouth after each use  Albuterol every 6 hours as needed for cough, wheeze, or chest congestion  Follow up in 4 weeks

## 2022-04-11 NOTE — Telephone Encounter (Signed)
Called and spoke with pt's daughter letting her know the results of the cxr and recs per VS and she verbalized understanding.  Stated to her that office should be calling to get her scheduled for thoracentesis and I stated to her that I would send message to [facility] about this.   Routing to PCCs.

## 2022-04-11 NOTE — Telephone Encounter (Signed)
Try calling patient and daughter, but no answer.  Please let her know that her chest xray shows recurrence of fluid around right lung.  I have sent an order to arrange for a thoracentesis with fluid sampling.    She should also increase her lasix to 40 mg twice per day for the next 3 days.    Will call her with results of fluid tests.

## 2022-04-13 ENCOUNTER — Ambulatory Visit (HOSPITAL_COMMUNITY)
Admission: RE | Admit: 2022-04-13 | Discharge: 2022-04-13 | Disposition: A | Discharge status: Home / Self Care | Payer: Medicare PPO | Source: Ambulatory Visit | Attending: Student | Admitting: Student

## 2022-04-13 ENCOUNTER — Ambulatory Visit (HOSPITAL_COMMUNITY)
Admission: RE | Admit: 2022-04-13 | Discharge: 2022-04-13 | Disposition: A | Discharge status: Home / Self Care | Payer: Medicare PPO | Source: Ambulatory Visit | Attending: Pulmonary Disease | Admitting: Pulmonary Disease

## 2022-04-13 VITALS — BP 141/81

## 2022-04-13 DIAGNOSIS — I509 Heart failure, unspecified: Secondary | ICD-10-CM | POA: Insufficient documentation

## 2022-04-13 DIAGNOSIS — J90 Pleural effusion, not elsewhere classified: Secondary | ICD-10-CM | POA: Diagnosis not present

## 2022-04-13 DIAGNOSIS — I4891 Unspecified atrial fibrillation: Secondary | ICD-10-CM | POA: Insufficient documentation

## 2022-04-13 LAB — BODY FLUID CELL COUNT WITH DIFFERENTIAL
Lymphs, Fluid: 45 %
Monocyte-Macrophage-Serous Fluid: 52 % (ref 50–90)
Neutrophil Count, Fluid: 3 % (ref 0–25)
Total Nucleated Cell Count, Fluid: 441 cu mm (ref 0–1000)

## 2022-04-13 LAB — GRAM STAIN

## 2022-04-13 LAB — LACTATE DEHYDROGENASE, PLEURAL OR PERITONEAL FLUID: LD, Fluid: 46 U/L — ABNORMAL HIGH (ref 3–23)

## 2022-04-13 LAB — PROTEIN, PLEURAL OR PERITONEAL FLUID: Total protein, fluid: <3 g/dL

## 2022-04-13 MED ORDER — LIDOCAINE HCL 1 % IJ SOLN
INTRAMUSCULAR | Status: AC
  Administered 2022-04-13: 15 mL
  Filled 2022-04-13: qty 20

## 2022-04-13 NOTE — Procedures (Signed)
PROCEDURE SUMMARY:  Successful US guided right thoracentesis. Yielded 1 L of amber-colored fluid. Pt tolerated procedure well. No immediate complications.  Specimen sent for labs. CXR ordered; no post-procedure pneumothorax identified.   EBL < 2 mL  Theresa Duty, NP 04/13/2022 12:25 PM

## 2022-04-14 LAB — TRIGLYCERIDES, BODY FLUIDS: Triglycerides, Fluid: 18 mg/dL

## 2022-04-14 LAB — CYTOLOGY - NON PAP

## 2022-04-16 LAB — CHOLESTEROL, BODY FLUID: Cholesterol, Fluid: 14 mg/dL

## 2022-04-17 ENCOUNTER — Telehealth: Payer: Self-pay | Admitting: Pulmonary Disease

## 2022-04-17 DIAGNOSIS — I272 Pulmonary hypertension, unspecified: Secondary | ICD-10-CM

## 2022-04-17 DIAGNOSIS — I5032 Chronic diastolic (congestive) heart failure: Secondary | ICD-10-CM

## 2022-04-17 NOTE — Telephone Encounter (Signed)
Ordered labs for [facility] in [place] since this would be closest location for patient to have labs done. Advised daughter to call us if there are any concerns/ issues with order when patient presents to [facility] for testing. Lab order placed. Nothing further needed

## 2022-04-17 NOTE — Telephone Encounter (Signed)
Rt thoracentesis 04/13/22 >> 1 liter amber fluid, protein < 3, LDH 46, cholesterol 14, triglyceride 18, WBC 441, (52% M, 45% L, 3% N), cytology - reactive mesothelial cells   Spoke with patient's daughter about results.  Advised her to continue lasix 40 mg bid.  Will arrange for referral to cardiology with [facility].  Will have my nurse contact her to arrange for BMET and Magnesium level later this week.

## 2022-04-18 LAB — CULTURE, BODY FLUID W GRAM STAIN -BOTTLE: Culture: NO GROWTH

## 2022-04-28 ENCOUNTER — Other Ambulatory Visit: Payer: Self-pay

## 2022-04-28 DIAGNOSIS — I272 Pulmonary hypertension, unspecified: Secondary | ICD-10-CM

## 2022-04-29 LAB — BASIC METABOLIC PANEL
BUN/Creatinine Ratio: 10 — ABNORMAL LOW (ref 12–28)
BUN: 10 mg/dL (ref 8–27)
CO2: 31 mmol/L — ABNORMAL HIGH (ref 20–29)
Calcium: 9.3 mg/dL (ref 8.7–10.3)
Chloride: 99 mmol/L (ref 96–106)
Creatinine, Ser: 0.96 mg/dL (ref 0.57–1.00)
Glucose: 87 mg/dL (ref 70–99)
Potassium: 4.4 mmol/L (ref 3.5–5.2)
Sodium: 143 mmol/L (ref 134–144)
eGFR: 62 mL/min/{1.73_m2} (ref >59)

## 2022-04-29 LAB — MAGNESIUM: Magnesium: 1.6 mg/dL (ref 1.6–2.3)

## 2022-04-30 ENCOUNTER — Ambulatory Visit (HOSPITAL_BASED_OUTPATIENT_CLINIC_OR_DEPARTMENT_OTHER): Payer: Medicare PPO | Attending: Pulmonary Disease | Admitting: Pulmonary Disease

## 2022-05-03 ENCOUNTER — Ambulatory Visit (HOSPITAL_BASED_OUTPATIENT_CLINIC_OR_DEPARTMENT_OTHER): Payer: Medicare PPO | Admitting: Pulmonary Disease

## 2022-05-10 ENCOUNTER — Telehealth: Payer: Self-pay | Admitting: Pulmonary Disease

## 2022-05-10 ENCOUNTER — Ambulatory Visit: Payer: Medicare PPO | Admitting: Primary Care

## 2022-06-13 ENCOUNTER — Ambulatory Visit (HOSPITAL_BASED_OUTPATIENT_CLINIC_OR_DEPARTMENT_OTHER): Payer: Medicare PPO | Admitting: Pulmonary Disease

## 2022-07-10 ENCOUNTER — Telehealth (HOSPITAL_COMMUNITY): Payer: Self-pay

## 2022-07-10 NOTE — Telephone Encounter (Signed)
Hey, did you ever see a referral for this patient from Pulmonary from January this year? They were calling to follow up to see if she was scheduled and the only thing I see is from last year. Can you schedule her?

## 2022-07-19 IMAGING — DX DG CHEST 2V
2 series · 2 of 2 positions shown · non-contrast
Comparison: 08/04/2020

CLINICAL DATA: Recurrent right pleural effusion.

EXAM:
CHEST - 2 VIEW

[chest pa]
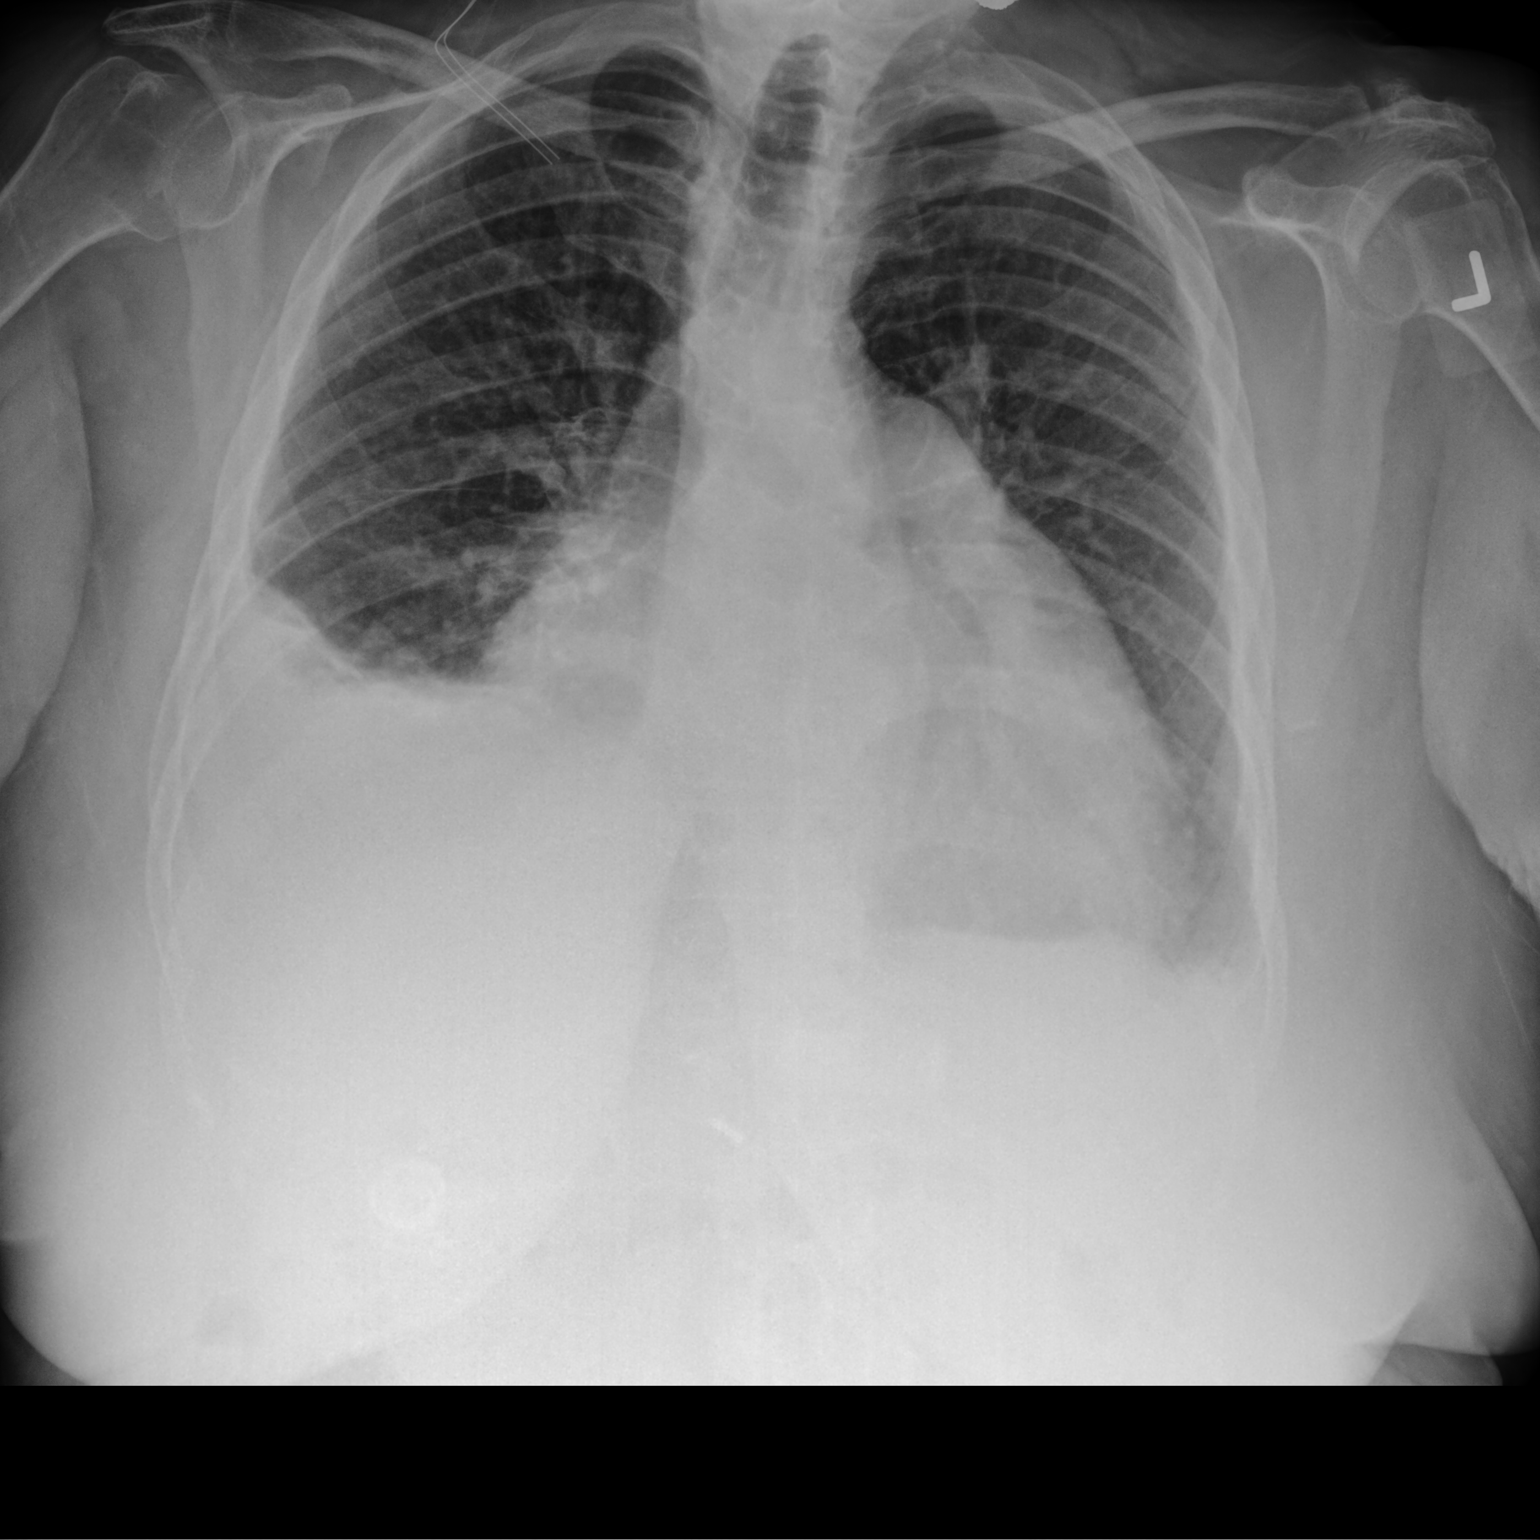

[chest lat]
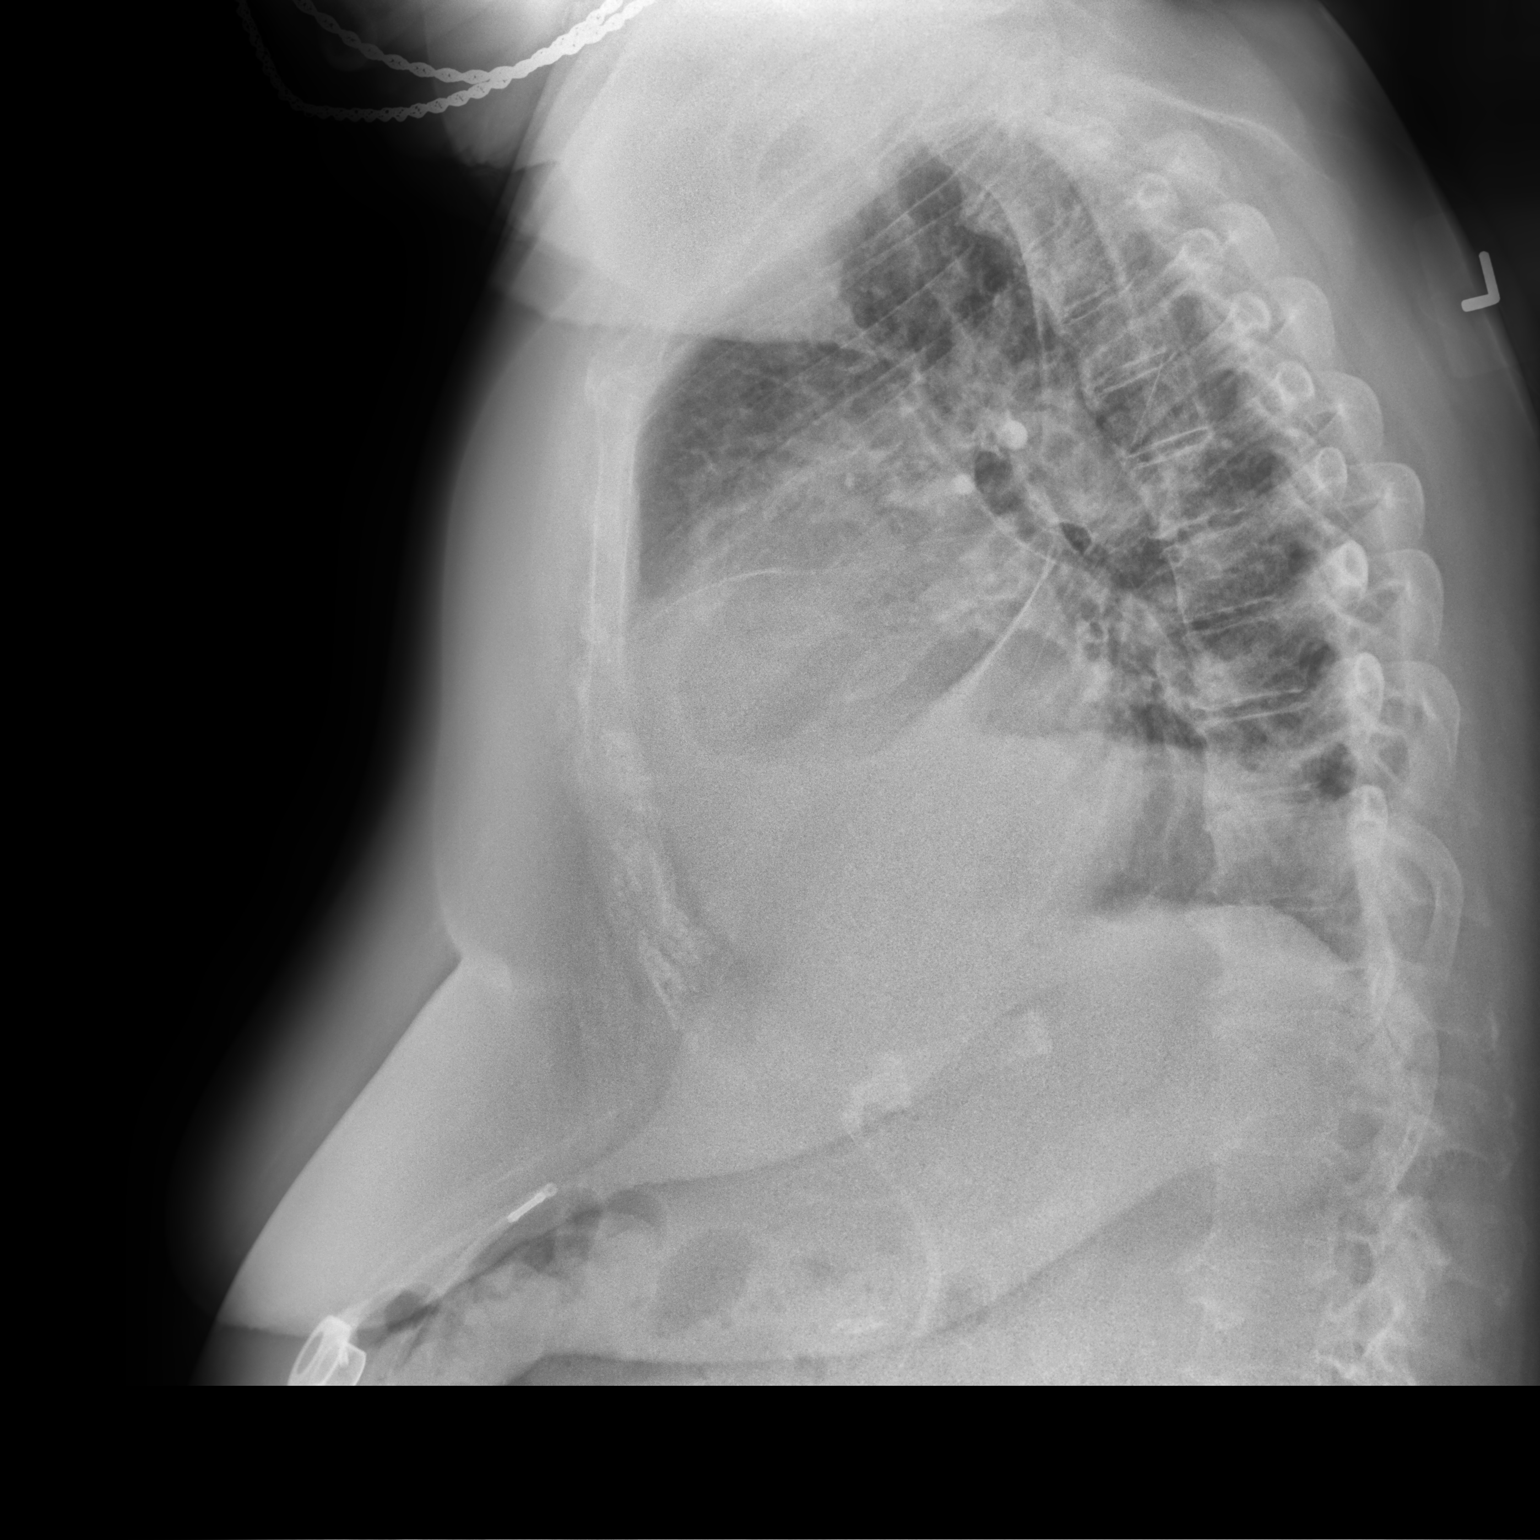

[2 of 2 positions shown; findings below may reference images not displayed]

FINDINGS: Stable cardiac enlargement. There is a moderate right pleural
effusion which is increased in volume compared with the previous
exam. Overlying platelike atelectasis noted. Pulmonary vascular
congestion without frank edema. Osseous structures are unremarkable.
IMPRESSION: 1. Increase in volume of right pleural effusion.
2. Cardiac enlargement and pulmonary vascular congestion.

## 2022-07-24 ENCOUNTER — Encounter (HOSPITAL_COMMUNITY): Payer: Medicare PPO | Admitting: Internal Medicine

## 2022-07-26 NOTE — Telephone Encounter (Signed)
Seems like encounter was open in error so closing encounter.  

## 2022-08-21 ENCOUNTER — Telehealth: Payer: Self-pay | Admitting: Pulmonary Disease

## 2022-08-21 NOTE — Telephone Encounter (Signed)
Called and spoke with patient's daughter. I was able to get her scheduled with Dr. Celine Mans tomorrow at 1pm since neither Dr. Craige Cotta or any of the apps had any openings. She is aware this appt will be at [facility].   Nothing further needed at time of call.

## 2022-08-21 NOTE — Telephone Encounter (Signed)
Pt daughter stated her mother has fluid build up on right side of her lungs and doesn't know what to do, please advise

## 2022-08-21 NOTE — Telephone Encounter (Signed)
Last chest xray in our system is from April 13, 2022.  I am not able to review the chest xray she had with her PCP.  Please schedule an ROV with me or an NP this week to assess further.

## 2022-08-21 NOTE — Telephone Encounter (Signed)
Spoke with patients daughter. She states patient daughter has fluid build up on the rt side of her lungs again. PCP completed chest xray and last visit- called with results today. Patient has advised she didn't rest well last night  Dr. Craige Cotta please advise?

## 2022-08-22 ENCOUNTER — Ambulatory Visit: Payer: Medicare PPO | Admitting: Internal Medicine

## 2022-08-22 ENCOUNTER — Encounter: Payer: Self-pay | Admitting: Internal Medicine

## 2022-08-22 VITALS — BP 116/68 | HR 71 | Temp 97.9°F | Ht 62.0 in | Wt 206.0 lb

## 2022-08-22 DIAGNOSIS — J9611 Chronic respiratory failure with hypoxia: Secondary | ICD-10-CM

## 2022-08-22 DIAGNOSIS — J90 Pleural effusion, not elsewhere classified: Secondary | ICD-10-CM | POA: Diagnosis not present

## 2022-08-22 DIAGNOSIS — G4733 Obstructive sleep apnea (adult) (pediatric): Secondary | ICD-10-CM | POA: Diagnosis not present

## 2022-08-22 DIAGNOSIS — I2723 Pulmonary hypertension due to lung diseases and hypoxia: Secondary | ICD-10-CM | POA: Diagnosis not present

## 2022-08-22 NOTE — Progress Notes (Signed)
Annette Moore    [id number]    1945-10-01  Primary Care Physician:Hunter, Antonietta Barcelona, FNP Date of Appointment: 08/22/2022 Established Patient Visit  Chief complaint:   Chief Complaint  Patient presents with   Follow-up    Pt still sob after being in the hospital. Pt has more fluid.      HPI: Annette Moore is a 77 y.o. woman with former tobacco use, OSA, Chronic HFpEF, Chronic respiratory failure on 3LNC. Sees Dr. Craige Cotta.   Interval Updates: Here for acute visit today. She has a history of recurrent pleural effusion and has had thoracentesis [place] [address],  May 2023, Jan 2024. These pleural studies have indicated transudative effusion.   She was hospitalized last month at [facility] for pneumonia and pleural effusion. Was treated with antibiotics and thoracentesis. Finished abx. Now having worsening shortness of breath, can't lay flat. Can't sleep.   Echocardiogram March 2023 - shows elevated PASP elevated and RVSP eleavted at 76.2normal LVEF.   I have reviewed the patient's family social and past medical history and updated as appropriate.   Past Medical History:  Diagnosis Date   Allergy    Anxiety    Asthma    Atrial fibrillation (HCC) 11/2014   chronic   Cellulitis of right leg    CHF (congestive heart failure) (HCC)    Chronic low back pain    Cirrhosis of liver (HCC)    CMC arthritis    Depression    GERD (gastroesophageal reflux disease)    Gout    History of CVA (cerebrovascular accident)    hx of ischemic stroke   History of laparoscopic adjustable gastric banding    Hyperlipidemia    Hypertension    Hypertension    Ischemic cerebrovascular accident (CVA) (HCC)    Nonrheumatic tricuspid valve regurgitation    moderate -severe ECHO 11-2019   OSA (obstructive sleep apnea)    Osteopenia    Panic disorder    Pulmonary hypertension (HCC)    RBC microcytosis    Sensorineural hearing loss (SNHL), bilateral    Spinal stenosis    lumbar  region   Tremor    Type 2 diabetes mellitus (HCC)    w/ microalbuminuria without long term use of insulin   Vitamin D deficiency     Past Surgical History:  Procedure Laterality Date   CARDIAC CATHETERIZATION  11/2016   COLOSTOMY     LAPAROSCOPIC CHOLECYSTECTOMY  04/2020   w lysis of adhesions   LAPAROSCOPIC GASTRIC BANDING     THORACENTESIS Right 11/2020   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   UPPER GI ENDOSCOPY      Family History  Problem Relation Age of Onset   Heart disease Mother    Hypertension Mother    Seizures Mother    Heart disease Father    Hypertension Father    Seizures Sister    Seizures Maternal Grandmother     Social History   Occupational History   Not on file  Tobacco Use   Smoking status: Former    Types: Cigarettes    Quit date: 10/29/1976    Years since quitting: 45.8   Smokeless tobacco: Never  Vaping Use   Vaping Use: Never used  Substance and Sexual Activity   Alcohol use: No   Drug use: No   Sexual activity: Not on file     Physical Exam: Blood pressure 116/68, pulse 71, temperature 97.9 F (36.6 C), temperature source Oral,  height 5\' 2"  (1.575 m), weight 206 lb (93.4 kg), SpO2 96 %.  Gen:      No acute distress, chronically ill in wheelchair Lungs:    breath sounds diminished right lung base, no wheeze or increased wob.  CV:         Regular rate and rhythm; no murmurs, rubs, or gallops.  No pedal edema   Data Reviewed: Imaging: I have personally reviewed the bedside ultrasound 08/22/22 - free flowing right pleural effusion.    PFTs:     Latest Ref Rng & Units 04/22/2021   11:04 AM  PFT Results  FVC-Pre L 1.08   FVC-Predicted Pre % 52   FVC-Post L 1.01   FVC-Predicted Post % 49   Pre FEV1/FVC % % 83   Post FEV1/FCV % % 90   FEV1-Pre L 0.90   FEV1-Predicted Pre % 57   FEV1-Post L 0.91   DLCO uncorrected ml/min/mmHg 10.68   DLCO UNC% % 59   DLCO corrected ml/min/mmHg 10.68   DLCO COR %Predicted % 59   DLVA Predicted % 96      Labs:  Immunization status: Immunization History  Administered Date(s) Administered   Influenza, High Dose Seasonal PF 01/19/2016, 03/16/2017, 05/17/2018, 01/02/2019, 12/09/2019, 12/21/2020   Influenza-Unspecified 01/19/2016   PFIZER Comirnaty(Gray Top)Covid-19 Tri-Sucrose Vaccine 08/01/2019, 08/22/2019   PFIZER(Purple Top)SARS-COV-2 Vaccination 08/01/2019, 08/22/2019   Pneumococcal Conjugate-13 01/28/2014   Pneumococcal Polysaccharide-23 04/07/2008, 03/16/2017   Tdap 11/20/2008, 09/07/2019    External Records Personally Reviewed: pulmonary  Assessment:  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 She was hoping I would do it in an office visit today. Discussed pros and cons with the patient of thoracentesis. She notes it has been progressively more painful each time we do the procedure.   I have schedule her for procedure is tomorrow at 1PM at [facility].  Do not take eliquis tonight or tomorrow morning.   Please follow up ASAP with your heart doctor and discuss increasing your lasix dosing. You probably need it twice a day to prevent this fluid from re-accumulating.    Return to Care: Please schedule a follow up appointment with Dr. Craige Cotta.    Durel Salts, MD Pulmonary and Critical Care Medicine [facility] [phone]

## 2022-08-22 NOTE — Patient Instructions (Addendum)
Please schedule a follow up appointment with Dr. Craige Cotta.   Your procedure is tomorrow at 1PM at [facility].  Do not take your eliquis tonight or tomorrow morning.   Please follow up ASAP with your heart doctor and discuss increasing your lasix dosing. You probably need it twice a day.

## 2022-08-23 ENCOUNTER — Encounter (HOSPITAL_COMMUNITY)
Admission: RE | Disposition: A | Discharge status: Home / Self Care | Payer: Self-pay | Source: Home / Self Care | Attending: Pulmonary Disease

## 2022-08-23 ENCOUNTER — Ambulatory Visit (HOSPITAL_COMMUNITY)
Admission: RE | Admit: 2022-08-23 | Discharge: 2022-08-23 | Disposition: A | Discharge status: Home / Self Care | Payer: Medicare PPO | Attending: Pulmonary Disease | Admitting: Pulmonary Disease

## 2022-08-23 ENCOUNTER — Encounter (HOSPITAL_COMMUNITY): Payer: Self-pay | Admitting: Pulmonary Disease

## 2022-08-23 ENCOUNTER — Other Ambulatory Visit: Payer: Self-pay

## 2022-08-23 ENCOUNTER — Ambulatory Visit (HOSPITAL_COMMUNITY): Payer: Medicare PPO

## 2022-08-23 DIAGNOSIS — F32A Depression, unspecified: Secondary | ICD-10-CM | POA: Insufficient documentation

## 2022-08-23 DIAGNOSIS — I4891 Unspecified atrial fibrillation: Secondary | ICD-10-CM | POA: Diagnosis not present

## 2022-08-23 DIAGNOSIS — Z8673 Personal history of transient ischemic attack (TIA), and cerebral infarction without residual deficits: Secondary | ICD-10-CM | POA: Diagnosis not present

## 2022-08-23 DIAGNOSIS — E559 Vitamin D deficiency, unspecified: Secondary | ICD-10-CM | POA: Diagnosis not present

## 2022-08-23 DIAGNOSIS — F419 Anxiety disorder, unspecified: Secondary | ICD-10-CM | POA: Diagnosis not present

## 2022-08-23 DIAGNOSIS — I272 Pulmonary hypertension, unspecified: Secondary | ICD-10-CM | POA: Insufficient documentation

## 2022-08-23 DIAGNOSIS — J90 Pleural effusion, not elsewhere classified: Secondary | ICD-10-CM | POA: Diagnosis present

## 2022-08-23 DIAGNOSIS — Z09 Encounter for follow-up examination after completed treatment for conditions other than malignant neoplasm: Secondary | ICD-10-CM | POA: Diagnosis not present

## 2022-08-23 DIAGNOSIS — E119 Type 2 diabetes mellitus without complications: Secondary | ICD-10-CM | POA: Diagnosis not present

## 2022-08-23 DIAGNOSIS — M545 Low back pain, unspecified: Secondary | ICD-10-CM | POA: Diagnosis not present

## 2022-08-23 DIAGNOSIS — I509 Heart failure, unspecified: Secondary | ICD-10-CM | POA: Insufficient documentation

## 2022-08-23 DIAGNOSIS — G8929 Other chronic pain: Secondary | ICD-10-CM | POA: Insufficient documentation

## 2022-08-23 DIAGNOSIS — F410 Panic disorder [episodic paroxysmal anxiety] without agoraphobia: Secondary | ICD-10-CM | POA: Diagnosis not present

## 2022-08-23 DIAGNOSIS — K219 Gastro-esophageal reflux disease without esophagitis: Secondary | ICD-10-CM | POA: Insufficient documentation

## 2022-08-23 DIAGNOSIS — I110 Hypertensive heart disease with heart failure: Secondary | ICD-10-CM | POA: Diagnosis not present

## 2022-08-23 DIAGNOSIS — I361 Nonrheumatic tricuspid (valve) insufficiency: Secondary | ICD-10-CM | POA: Insufficient documentation

## 2022-08-23 DIAGNOSIS — K7460 Unspecified cirrhosis of liver: Secondary | ICD-10-CM | POA: Diagnosis not present

## 2022-08-23 DIAGNOSIS — J45909 Unspecified asthma, uncomplicated: Secondary | ICD-10-CM | POA: Insufficient documentation

## 2022-08-23 DIAGNOSIS — M109 Gout, unspecified: Secondary | ICD-10-CM | POA: Insufficient documentation

## 2022-08-23 DIAGNOSIS — G4733 Obstructive sleep apnea (adult) (pediatric): Secondary | ICD-10-CM | POA: Insufficient documentation

## 2022-08-23 DIAGNOSIS — H905 Unspecified sensorineural hearing loss: Secondary | ICD-10-CM | POA: Insufficient documentation

## 2022-08-23 DIAGNOSIS — M8580 Other specified disorders of bone density and structure, unspecified site: Secondary | ICD-10-CM | POA: Diagnosis not present

## 2022-08-23 DIAGNOSIS — E785 Hyperlipidemia, unspecified: Secondary | ICD-10-CM | POA: Diagnosis not present

## 2022-08-23 HISTORY — PX: THORACENTESIS: SHX235

## 2022-08-23 LAB — BODY FLUID CELL COUNT WITH DIFFERENTIAL
Eos, Fluid: 0 %
Lymphs, Fluid: 80 %
Monocyte-Macrophage-Serous Fluid: 17 % — ABNORMAL LOW (ref 50–90)
Neutrophil Count, Fluid: 3 % (ref 0–25)
Total Nucleated Cell Count, Fluid: 341 cu mm (ref 0–1000)

## 2022-08-23 LAB — PROTEIN, PLEURAL OR PERITONEAL FLUID: Total protein, fluid: <3 g/dL

## 2022-08-23 LAB — LACTATE DEHYDROGENASE, PLEURAL OR PERITONEAL FLUID: LD, Fluid: 55 U/L — ABNORMAL HIGH (ref 3–23)

## 2022-08-23 SURGERY — THORACENTESIS
Anesthesia: LOCAL

## 2022-08-23 MED ORDER — LIDOCAINE HCL (PF) 2 % IJ SOLN
INTRAMUSCULAR | Status: AC
  Filled 2022-08-23: qty 10

## 2022-08-23 NOTE — H&P (Signed)
         Kellsie Gerl    [id number]    Nov 26, 1945  Primary Care Physician:Hunter, Antonietta Barcelona, FNP  Referring Physician: No referring provider defined for this encounter.  Chief complaint: Pleural effusion  HPI: 77 y.o. who  has a past medical history of Allergy, Anxiety, Asthma, Atrial fibrillation (HCC) (11/2014), Cellulitis of right leg, CHF (congestive heart failure) (HCC), Chronic low back pain, Cirrhosis of liver (HCC), CMC arthritis, Depression, GERD (gastroesophageal reflux disease), Gout, History of CVA (cerebrovascular accident), History of laparoscopic adjustable gastric banding, Hyperlipidemia, Hypertension, Hypertension, Ischemic cerebrovascular accident (CVA) (HCC), Nonrheumatic tricuspid valve regurgitation, OSA (obstructive sleep apnea), Osteopenia, Panic disorder, Pulmonary hypertension (HCC), RBC microcytosis, Sensorineural hearing loss (SNHL), bilateral, Spinal stenosis, Tremor, Type 2 diabetes mellitus (HCC), and Vitamin D deficiency.   She is a patient of Dr. Craige Cotta with recurrent pleural effusion with multiple thoracentesis with appears to be transudative on labs now with worsening shortness of breath.  She was seen by Dr. Celine Mans in office yesterday and scheduled for thoracentesis today for relief of symptoms.  Physical Exam: Blood pressure 118/62, pulse 71, temperature 98.1 F (36.7 C), temperature source Temporal, resp. rate (!) 26, height 5\' 2"  (1.575 m), weight 93 kg, SpO2 94 %. Gen:      No acute distress HEENT:  EOMI, sclera anicteric Neck:     No masses; no thyromegaly Lungs:    Clear to auscultation bilaterally; normal respiratory effort CV:         Regular rate and rhythm; no murmurs Abd:      + bowel sounds; soft, non-tender; no palpable masses, no distension Ext:    No edema; adequate peripheral perfusion Skin:      Warm and dry; no rash Neuro: alert and oriented x 3 Psych: normal mood and affect  Assessment/Plan: Recurrent right transudative  effusion Plan for right-sided thoracentesis today.  Risk/benefit explained but patient was consented for the procedure.  Chilton Greathouse MD  Pulmonary and Critical Care 08/23/2022, 3:03 PM  CC: No ref. provider found

## 2022-08-23 NOTE — Progress Notes (Signed)
1100cc serosanguineous fluid removed from right lung. Pt tolerated thoracentesis well

## 2022-08-23 NOTE — Procedures (Addendum)
Thoracentesis  Procedure Note  Annette Moore  [id number]  Apr 01, 1946  Date:08/23/22  Time:2:37 PM   Provider Performing:Rishan Oyama   Procedure: Thoracentesis with imaging guidance (23536)  Indication(s) Pleural Effusion  Consent Risks of the procedure as well as the alternatives and risks of each were explained to the patient and/or caregiver.  Consent for the procedure was obtained and is signed in the bedside chart  Anesthesia Topical only with 1% lidocaine    Time Out Verified patient identification, verified procedure, site/side was marked, verified correct patient position, special equipment/implants available, medications/allergies/relevant history reviewed, required imaging and test results available.   Sterile Technique Maximal sterile technique including full sterile barrier drape, hand hygiene, sterile gown, sterile gloves, mask, hair covering, sterile ultrasound probe cover (if used).  Procedure Description Ultrasound was used to identify appropriate pleural anatomy for placement and overlying skin marked.  Area of drainage cleaned and draped in sterile fashion. Lidocaine was used to anesthetize the skin and subcutaneous tissue.  1100 cc's of serosanguinous appearing fluid was drained from the right pleural space. Procedure was stopped as she developed chest pain and cough. Catheter then removed and bandaid applied to site.   Complications/Tolerance None; patient tolerated the procedure well. Chest X-ray is ordered to confirm no post-procedural complication.   EBL Minimal   Specimen(s) Pleural fluid     Chilton Greathouse MD [facility] & Critical care See Amion for pager  If no response to pager , please call [phone] until 7pm After 7:00 pm call Elink  [phone] 08/23/2022, 2:38 PM

## 2022-08-25 LAB — CYTOLOGY - NON PAP

## 2022-08-26 LAB — BODY FLUID CULTURE W GRAM STAIN: Culture: NO GROWTH

## 2022-08-27 ENCOUNTER — Encounter (HOSPITAL_COMMUNITY): Payer: Self-pay | Admitting: Pulmonary Disease

## 2022-08-28 ENCOUNTER — Ambulatory Visit (HOSPITAL_BASED_OUTPATIENT_CLINIC_OR_DEPARTMENT_OTHER): Payer: Medicare PPO | Admitting: Pulmonary Disease

## 2022-12-22 ENCOUNTER — Telehealth: Payer: Self-pay | Admitting: Pulmonary Disease

## 2022-12-22 NOTE — Telephone Encounter (Signed)
Called and spoke with the pt about the pcp visit. Pt daughter states she does not want to take the pt to the ED if she does not have to. Pt sob is still baseline. She went to her PCP for cold symptoms. Can we take a look at pt cxr? Dr. Craige Cotta please advise

## 2022-12-22 NOTE — Telephone Encounter (Signed)
Where did she get the xray done at?  The most recent chest xray in our system is from 08/23/22.  If she had this done outside the [facility] system, then I would not be able to review it.  Can either make an appointment next week with any available provider, or she can go to urgent care/emergency room to have this assessed further.

## 2022-12-22 NOTE — Telephone Encounter (Signed)
Patient's daughter is calling because mother has fluid buildup on her lungs. She had an xray this morning at her pcp  that showed it on the right side. PCP advised that she go to the Er. Daughter states that her mother would be there a couple of days and does not want to do that.In the past when fluid is found on her lungs in our office it is usually a day outpatient appointment. Please call daughter and advise [phone]

## 2022-12-25 NOTE — Telephone Encounter (Signed)
Called patient's daughter back, her mom was scheduled to see Dr. Wynona Neat tomorrow.  She is wanting to get her mother scheduled for a thoracentesis for the fluid build up.  I advised her that he can do that and that he can also [place] ultrasound to see if she has fluid and approximately how much fluid.  She verbalized understanding.  She was asked to bring her cxr on a disk so Dr. Wynona Neat can view the image.  Nothing further needed.

## 2022-12-25 NOTE — Telephone Encounter (Signed)
Called and spoke with patient's daughter, Jatara Hevia [facility]), she states her mom had the CXR on 12/21/2022 at [facility] office, she is her PCP and is with [facility].  Advised I would let Dr. Craige Cotta know and see if he is able to view it in care everywhere and call her back.  She  verbalized understanding.  Dr. Craige Cotta, patient had the CXR done on 12/21/22 at [facility] office, she is with [facility].  Are you able to see the results in Care everywhere?

## 2022-12-26 ENCOUNTER — Ambulatory Visit: Payer: Medicare PPO | Admitting: Pulmonary Disease

## 2022-12-26 ENCOUNTER — Telehealth: Payer: Self-pay | Admitting: Pulmonary Disease

## 2022-12-26 ENCOUNTER — Other Ambulatory Visit: Payer: Self-pay | Admitting: Pulmonary Disease

## 2022-12-26 ENCOUNTER — Encounter: Payer: Self-pay | Admitting: Pulmonary Disease

## 2022-12-26 VITALS — BP 110/60 | HR 115 | Ht 62.0 in | Wt 226.4 lb

## 2022-12-26 DIAGNOSIS — J90 Pleural effusion, not elsewhere classified: Secondary | ICD-10-CM

## 2022-12-26 NOTE — H&P (View-Only) (Signed)
Annette Moore    [id number]    [birth year]  Primary Care Physician:Hunter, Antonietta Barcelona, FNP  Referring Physician: Eather Colas, FNP [phone] [place],  [place] [address]  Chief complaint:   Patient is being seen for shortness of breath  HPI:  Patient with a history of recurrent pleural effusion, more short of breath the last few days with weight gain  Recently had a thoracentesis performed 08/23/2022, previous thoracentesis in 2022, May 2023, January 2024-previous fluids were notable for transudative effusions  History of anxiety, asthma, atrial fibrillation, congestive heart failure, liver cirrhosis, GERD, CVA, obstructive sleep apnea, pulmonary hypertension type 2 diabetes History of chronic respiratory failure for which he is on oxygen at 3 L  Was following up with Dr. Craige Cotta  Echo shows severe pulmonary hypertension  Reformed smoker-quit smoking about 45 years ago, has a history of obstructive sleep apnea   Outpatient Encounter Medications as of 12/26/2022  Medication Sig   acetaminophen (TYLENOL) 325 MG tablet Take by mouth.   albuterol (PROVENTIL) (2.5 MG/3ML) 0.083% nebulizer solution Inhale into the lungs.   albuterol (VENTOLIN HFA) 108 (90 Base) MCG/ACT inhaler Inhale into the lungs.   ALPRAZolam (XANAX) 0.25 MG tablet Take by mouth.   apixaban (ELIQUIS) 5 MG TABS tablet TAKE 1 TABLET(5 MG) BY MOUTH TWICE DAILY   benzonatate (TESSALON) 100 MG capsule Take by mouth.   budesonide-formoterol (SYMBICORT) 80-4.5 MCG/ACT inhaler Inhale 2 puffs into the lungs in the morning and at bedtime.   colchicine 0.6 MG tablet Take 1 tablet (0.6 mg total) by mouth daily.   diltiazem (TIAZAC) 240 MG 24 hr capsule Take 240 mg by mouth daily.   furosemide (LASIX) 40 MG tablet Take 40 mg by mouth daily. (Patient not taking: Reported on 12/22/2022)   gabapentin (NEURONTIN) 100 MG capsule Take by mouth.   magnesium oxide (MAG-OX) 400 MG tablet Take 1 tablet by mouth  2 (two) times daily.   metoprolol tartrate (LOPRESSOR) 25 MG tablet Take 50 mg by mouth 2 (two) times daily.   ondansetron (ZOFRAN-ODT) 4 MG disintegrating tablet Take 1 tablet (4 mg total) by mouth every 6 (six) hours as needed for nausea or vomiting.   OXYGEN Inhale 3 L into the lungs as needed.   potassium chloride (KLOR-CON M) 10 MEQ tablet Take 1 tablet (10 mEq total) by mouth daily.   Vitamin D, Ergocalciferol, (DRISDOL) 1.25 MG (50000 UNIT) CAPS capsule Take 1 capsule by mouth 2 (two) times a week.   No facility-administered encounter medications on file as of 12/26/2022.    Allergies as of 12/26/2022 - Review Complete 08/23/2022  Allergen Reaction Noted   Codeine Hives and Itching 02/25/2020   Sulfa antibiotics Hives 10/29/2013   Amlodipine Nausea Only 08/15/2021   Ibuprofen Other (See Comments) 02/11/2016   Oxycodone Palpitations 08/15/2021    Past Medical History:  Diagnosis Date   Allergy    Anxiety    Asthma    Atrial fibrillation (HCC) 11/2014   chronic   Cellulitis of right leg    CHF (congestive heart failure) (HCC)    Chronic low back pain    Cirrhosis of liver (HCC)    CMC arthritis    Depression    GERD (gastroesophageal reflux disease)    Gout    History of CVA (cerebrovascular accident)    hx of ischemic stroke   History of laparoscopic adjustable gastric banding    Hyperlipidemia  Hypertension    Hypertension    Ischemic cerebrovascular accident (CVA) (HCC)    Nonrheumatic tricuspid valve regurgitation    moderate -severe ECHO 11-2019   OSA (obstructive sleep apnea)    Osteopenia    Panic disorder    Pulmonary hypertension (HCC)    RBC microcytosis    Sensorineural hearing loss (SNHL), bilateral    Spinal stenosis    lumbar region   Tremor    Type 2 diabetes mellitus (HCC)    w/ microalbuminuria without long term use of insulin   Vitamin D deficiency     Past Surgical History:  Procedure Laterality Date   CARDIAC CATHETERIZATION  11/2016    COLOSTOMY     LAPAROSCOPIC CHOLECYSTECTOMY  04/2020   w lysis of adhesions   LAPAROSCOPIC GASTRIC BANDING     THORACENTESIS Right 11/2020   THORACENTESIS N/A 08/23/2022   Procedure: Alanson Puls;  Surgeon: Chilton Greathouse, MD;  Location: [facility];  Service: Cardiopulmonary;  Laterality: N/A;   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   UPPER GI ENDOSCOPY      Family History  Problem Relation Age of Onset   Heart disease Mother    Hypertension Mother    Seizures Mother    Heart disease Father    Hypertension Father    Seizures Sister    Seizures Maternal Grandmother     Social History   Socioeconomic History   Marital status: Married    Spouse name: Not on file   Number of children: Not on file   Years of education: Not on file   Highest education level: Not on file  Occupational History   Not on file  Tobacco Use   Smoking status: Former    Current packs/day: 0.00    Types: Cigarettes    Quit date: 10/29/1976    Years since quitting: 46.1   Smokeless tobacco: Never  Vaping Use   Vaping status: Never Used  Substance and Sexual Activity   Alcohol use: No   Drug use: No   Sexual activity: Not on file  Other Topics Concern   Not on file  Social History Narrative   Not on file   Social Determinants of Health   Financial Resource Strain: Not on file  Food Insecurity: Not on file  Transportation Needs: Not on file  Physical Activity: Not on file  Stress: Not on file  Social Connections: Not on file  Intimate Partner Violence: Not on file    Review of Systems  Respiratory:  Positive for shortness of breath and wheezing.     There were no vitals filed for this visit.   Physical Exam Constitutional:      Appearance: She is obese.  HENT:     Head: Normocephalic.     Mouth/Throat:     Mouth: Mucous membranes are moist.  Eyes:     General: No scleral icterus. Cardiovascular:     Rate and Rhythm: Normal rate and regular rhythm.     Heart sounds: No murmur  heard.    No friction rub.  Pulmonary:     Effort: No respiratory distress.     Breath sounds: No stridor. No wheezing or rhonchi.  Musculoskeletal:        General: Swelling present.     Cervical back: No rigidity or tenderness.  Skin:    General: Skin is warm.     Coloration: Skin is not jaundiced.  Neurological:     Mental Status: She is alert.  Psychiatric:  Mood and Affect: Mood normal.     Data Reviewed: Chest x-ray 12/22/2022 reviewed showing large right pleural effusion  Previous sleep study 05/03/2021 show AHI of 63, O2 nadir 58  Recent visit with Dr. Celine Mans, Dr. Craige Cotta reviewed  Assessment:  Recurrent right pleural effusion  Shortness of breath  Chronic hypoxemic respiratory failure -Continue oxygen supplementation  Wheezing  Pulmonary hypertension  Atrial fibrillation  History of asthma -On Symbicort  Chronic diastolic congestive heart failure  Liver cirrhosis  Findings on x-ray discussed with the patient and this is likely contributing to her worsening shortness of breath, wheezing, weight gain  Plan/Recommendations: Will plan for ultrasound-guided thoracentesis  She will hold Eliquis at present, to resume Eliquis after thoracentesis  Continue bronchodilator treatments  Will follow-up in about 3 months  Virl Diamond MD [facility] 12/26/2022, 10:46 AM  CC: Eather Colas, FNP

## 2022-12-26 NOTE — Patient Instructions (Addendum)
Schedule for thoracentesis on Thursday with Dr. Chestine Spore at [facility] -I have placed a case request for Thursday, -You will get a call about the timing -Stay off Eliquis, can resume Eliquis later on Thursday  Continue with your water pills  Continue breathing treatments  Call us with significant concerns  Tentative follow-up in 3 months

## 2022-12-26 NOTE — Progress Notes (Signed)
Annette Moore    [id number]    [birth year]  Primary Care Physician:Hunter, Annette Barcelona, FNP  Referring Physician: Eather Colas, FNP [phone] [place],  [place] [address]  Chief complaint:   Patient is being seen for shortness of breath  HPI:  Patient with a history of recurrent pleural effusion, more short of breath the last few days with weight gain  Recently had a thoracentesis performed 08/23/2022, previous thoracentesis in 2022, May 2023, January 2024-previous fluids were notable for transudative effusions  History of anxiety, asthma, atrial fibrillation, congestive heart failure, liver cirrhosis, GERD, CVA, obstructive sleep apnea, pulmonary hypertension type 2 diabetes History of chronic respiratory failure for which he is on oxygen at 3 L  Was following up with Annette Moore  Echo shows severe pulmonary hypertension  Reformed smoker-quit smoking about 45 years ago, has a history of obstructive sleep apnea   Outpatient Encounter Medications as of 12/26/2022  Medication Sig   acetaminophen (TYLENOL) 325 MG tablet Take by mouth.   albuterol (PROVENTIL) (2.5 MG/3ML) 0.083% nebulizer solution Inhale into the lungs.   albuterol (VENTOLIN HFA) 108 (90 Base) MCG/ACT inhaler Inhale into the lungs.   ALPRAZolam (XANAX) 0.25 MG tablet Take by mouth.   apixaban (ELIQUIS) 5 MG TABS tablet TAKE 1 TABLET(5 MG) BY MOUTH TWICE DAILY   benzonatate (TESSALON) 100 MG capsule Take by mouth.   budesonide-formoterol (SYMBICORT) 80-4.5 MCG/ACT inhaler Inhale 2 puffs into the lungs in the morning and at bedtime.   colchicine 0.6 MG tablet Take 1 tablet (0.6 mg total) by mouth daily.   diltiazem (TIAZAC) 240 MG 24 hr capsule Take 240 mg by mouth daily.   furosemide (LASIX) 40 MG tablet Take 40 mg by mouth daily. (Patient not taking: Reported on 12/22/2022)   gabapentin (NEURONTIN) 100 MG capsule Take by mouth.   magnesium oxide (MAG-OX) 400 MG tablet Take 1 tablet by mouth  2 (two) times daily.   metoprolol tartrate (LOPRESSOR) 25 MG tablet Take 50 mg by mouth 2 (two) times daily.   ondansetron (ZOFRAN-ODT) 4 MG disintegrating tablet Take 1 tablet (4 mg total) by mouth every 6 (six) hours as needed for nausea or vomiting.   OXYGEN Inhale 3 L into the lungs as needed.   potassium chloride (KLOR-CON M) 10 MEQ tablet Take 1 tablet (10 mEq total) by mouth daily.   Vitamin D, Ergocalciferol, (DRISDOL) 1.25 MG (50000 UNIT) CAPS capsule Take 1 capsule by mouth 2 (two) times a week.   No facility-administered encounter medications on file as of 12/26/2022.    Allergies as of 12/26/2022 - Review Complete 08/23/2022  Allergen Reaction Noted   Codeine Hives and Itching 02/25/2020   Sulfa antibiotics Hives 10/29/2013   Amlodipine Nausea Only 08/15/2021   Ibuprofen Other (See Comments) 02/11/2016   Oxycodone Palpitations 08/15/2021    Past Medical History:  Diagnosis Date   Allergy    Anxiety    Asthma    Atrial fibrillation (HCC) 11/2014   chronic   Cellulitis of right leg    CHF (congestive heart failure) (HCC)    Chronic low back pain    Cirrhosis of liver (HCC)    CMC arthritis    Depression    GERD (gastroesophageal reflux disease)    Gout    History of CVA (cerebrovascular accident)    hx of ischemic stroke   History of laparoscopic adjustable gastric banding    Hyperlipidemia  Hypertension    Hypertension    Ischemic cerebrovascular accident (CVA) (HCC)    Nonrheumatic tricuspid valve regurgitation    moderate -severe ECHO 11-2019   OSA (obstructive sleep apnea)    Osteopenia    Panic disorder    Pulmonary hypertension (HCC)    RBC microcytosis    Sensorineural hearing loss (SNHL), bilateral    Spinal stenosis    lumbar region   Tremor    Type 2 diabetes mellitus (HCC)    w/ microalbuminuria without long term use of insulin   Vitamin D deficiency     Past Surgical History:  Procedure Laterality Date   CARDIAC CATHETERIZATION  11/2016    COLOSTOMY     LAPAROSCOPIC CHOLECYSTECTOMY  04/2020   w lysis of adhesions   LAPAROSCOPIC GASTRIC BANDING     THORACENTESIS Right 11/2020   THORACENTESIS N/A 08/23/2022   Procedure: Alanson Puls;  Surgeon: Annette Greathouse, MD;  Location: [facility];  Service: Cardiopulmonary;  Laterality: N/A;   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   UPPER GI ENDOSCOPY      Family History  Problem Relation Age of Onset   Heart disease Mother    Hypertension Mother    Seizures Mother    Heart disease Father    Hypertension Father    Seizures Sister    Seizures Maternal Grandmother     Social History   Socioeconomic History   Marital status: Married    Spouse name: Not on file   Number of children: Not on file   Years of education: Not on file   Highest education level: Not on file  Occupational History   Not on file  Tobacco Use   Smoking status: Former    Current packs/day: 0.00    Types: Cigarettes    Quit date: 10/29/1976    Years since quitting: 46.1   Smokeless tobacco: Never  Vaping Use   Vaping status: Never Used  Substance and Sexual Activity   Alcohol use: No   Drug use: No   Sexual activity: Not on file  Other Topics Concern   Not on file  Social History Narrative   Not on file   Social Determinants of Health   Financial Resource Strain: Not on file  Food Insecurity: Not on file  Transportation Needs: Not on file  Physical Activity: Not on file  Stress: Not on file  Social Connections: Not on file  Intimate Partner Violence: Not on file    Review of Systems  Respiratory:  Positive for shortness of breath and wheezing.     There were no vitals filed for this visit.   Physical Exam Constitutional:      Appearance: She is obese.  HENT:     Head: Normocephalic.     Mouth/Throat:     Mouth: Mucous membranes are moist.  Eyes:     General: No scleral icterus. Cardiovascular:     Rate and Rhythm: Normal rate and regular rhythm.     Heart sounds: No murmur  heard.    No friction rub.  Pulmonary:     Effort: No respiratory distress.     Breath sounds: No stridor. No wheezing or rhonchi.  Musculoskeletal:        General: Swelling present.     Cervical back: No rigidity or tenderness.  Skin:    General: Skin is warm.     Coloration: Skin is not jaundiced.  Neurological:     Mental Status: She is alert.  Psychiatric:  Mood and Affect: Mood normal.     Data Reviewed: Chest x-ray 12/22/2022 reviewed showing large right pleural effusion  Previous sleep study 05/03/2021 show AHI of 63, O2 nadir 58  Recent visit with Dr. Celine Mans, Annette Moore reviewed  Assessment:  Recurrent right pleural effusion  Shortness of breath  Chronic hypoxemic respiratory failure -Continue oxygen supplementation  Wheezing  Pulmonary hypertension  Atrial fibrillation  History of asthma -On Symbicort  Chronic diastolic congestive heart failure  Liver cirrhosis  Findings on x-ray discussed with the patient and this is likely contributing to her worsening shortness of breath, wheezing, weight gain  Plan/Recommendations: Will plan for ultrasound-guided thoracentesis  She will hold Eliquis at present, to resume Eliquis after thoracentesis  Continue bronchodilator treatments  Will follow-up in about 3 months  Virl Diamond MD [facility] 12/26/2022, 10:46 AM  CC: Annette Colas, FNP

## 2022-12-26 NOTE — Telephone Encounter (Signed)
Please call Bradly Chris with CPT code for this PT. [phone] [id number]  Secure VM. Thanks

## 2022-12-27 ENCOUNTER — Telehealth: Payer: Self-pay | Admitting: Pulmonary Disease

## 2022-12-27 NOTE — Telephone Encounter (Signed)
Patient's daughter is calling because she does not want her mother to have her surgery at [facility]. Her and her mother prefer [facility]. Please call and advise so they know which appointment to go to. [phone]

## 2022-12-28 ENCOUNTER — Encounter (HOSPITAL_COMMUNITY): Payer: Self-pay

## 2022-12-28 ENCOUNTER — Encounter (HOSPITAL_COMMUNITY): Payer: Self-pay | Admitting: Internal Medicine

## 2022-12-28 ENCOUNTER — Ambulatory Visit (HOSPITAL_COMMUNITY): Payer: Medicare PPO

## 2022-12-28 ENCOUNTER — Ambulatory Visit (HOSPITAL_COMMUNITY): Admit: 2022-12-28 | Payer: Medicare PPO | Admitting: Critical Care Medicine

## 2022-12-28 ENCOUNTER — Encounter (HOSPITAL_COMMUNITY)
Admission: RE | Disposition: A | Discharge status: Home / Self Care | Payer: Self-pay | Source: Home / Self Care | Attending: Internal Medicine

## 2022-12-28 ENCOUNTER — Ambulatory Visit (HOSPITAL_COMMUNITY)
Admission: RE | Admit: 2022-12-28 | Discharge: 2022-12-28 | Disposition: A | Discharge status: Home / Self Care | Payer: Medicare PPO | Attending: Internal Medicine | Admitting: Internal Medicine

## 2022-12-28 DIAGNOSIS — Z87891 Personal history of nicotine dependence: Secondary | ICD-10-CM | POA: Diagnosis not present

## 2022-12-28 DIAGNOSIS — Z7901 Long term (current) use of anticoagulants: Secondary | ICD-10-CM | POA: Diagnosis not present

## 2022-12-28 DIAGNOSIS — E119 Type 2 diabetes mellitus without complications: Secondary | ICD-10-CM | POA: Insufficient documentation

## 2022-12-28 DIAGNOSIS — Z8673 Personal history of transient ischemic attack (TIA), and cerebral infarction without residual deficits: Secondary | ICD-10-CM | POA: Diagnosis not present

## 2022-12-28 DIAGNOSIS — I4891 Unspecified atrial fibrillation: Secondary | ICD-10-CM | POA: Insufficient documentation

## 2022-12-28 DIAGNOSIS — G4733 Obstructive sleep apnea (adult) (pediatric): Secondary | ICD-10-CM | POA: Insufficient documentation

## 2022-12-28 DIAGNOSIS — Z7951 Long term (current) use of inhaled steroids: Secondary | ICD-10-CM | POA: Diagnosis not present

## 2022-12-28 DIAGNOSIS — I272 Pulmonary hypertension, unspecified: Secondary | ICD-10-CM | POA: Insufficient documentation

## 2022-12-28 DIAGNOSIS — J90 Pleural effusion, not elsewhere classified: Secondary | ICD-10-CM

## 2022-12-28 DIAGNOSIS — J9611 Chronic respiratory failure with hypoxia: Secondary | ICD-10-CM | POA: Diagnosis not present

## 2022-12-28 DIAGNOSIS — K7460 Unspecified cirrhosis of liver: Secondary | ICD-10-CM | POA: Diagnosis not present

## 2022-12-28 DIAGNOSIS — Z9981 Dependence on supplemental oxygen: Secondary | ICD-10-CM | POA: Diagnosis not present

## 2022-12-28 DIAGNOSIS — I110 Hypertensive heart disease with heart failure: Secondary | ICD-10-CM | POA: Insufficient documentation

## 2022-12-28 DIAGNOSIS — J45909 Unspecified asthma, uncomplicated: Secondary | ICD-10-CM | POA: Diagnosis not present

## 2022-12-28 DIAGNOSIS — I5032 Chronic diastolic (congestive) heart failure: Secondary | ICD-10-CM | POA: Diagnosis not present

## 2022-12-28 DIAGNOSIS — R0602 Shortness of breath: Secondary | ICD-10-CM | POA: Diagnosis present

## 2022-12-28 DIAGNOSIS — F419 Anxiety disorder, unspecified: Secondary | ICD-10-CM | POA: Insufficient documentation

## 2022-12-28 HISTORY — PX: THORACENTESIS: SHX235

## 2022-12-28 LAB — BODY FLUID CELL COUNT WITH DIFFERENTIAL
Eos, Fluid: 0 %
Lymphs, Fluid: 66 %
Monocyte-Macrophage-Serous Fluid: 20 % — ABNORMAL LOW (ref 50–90)
Neutrophil Count, Fluid: 14 % (ref 0–25)
Total Nucleated Cell Count, Fluid: 333 cu mm (ref 0–1000)

## 2022-12-28 LAB — PROTEIN, PLEURAL OR PERITONEAL FLUID: Total protein, fluid: <3 g/dL

## 2022-12-28 LAB — LACTATE DEHYDROGENASE, PLEURAL OR PERITONEAL FLUID: LD, Fluid: 58 U/L — ABNORMAL HIGH (ref 3–23)

## 2022-12-28 LAB — ALBUMIN, PLEURAL OR PERITONEAL FLUID: Albumin, Fluid: <1.5 g/dL

## 2022-12-28 LAB — GLUCOSE, PLEURAL OR PERITONEAL FLUID: Glucose, Fluid: 93 mg/dL

## 2022-12-28 SURGERY — THORACENTESIS
Anesthesia: LOCAL | Laterality: Right

## 2022-12-28 NOTE — Discharge Instructions (Signed)
OK to resume Eliquis on 9/20. You need to follow up with your heart doctor - your fluid is building up due to congestive heart failure and you need more diuretics. I would not recommend recurrent thoracentesis due to discomfort.

## 2022-12-28 NOTE — Procedures (Signed)
Thoracentesis  Procedure Note  Annette Moore  [id number]  18-Feb-1946  Date:12/28/22  Time:3:20 PM   Provider Performing:Zephaniah Enyeart Kathie Rhodes Celine Mans   Procedure: Thoracentesis with imaging guidance (30865)  Indication(s) Pleural Effusion  Consent Risks of the procedure as well as the alternatives and risks of each were explained to the patient and/or caregiver.  Consent for the procedure was obtained and is signed in the bedside chart  Anesthesia Topical only with 1% lidocaine    Time Out Verified patient identification, verified procedure, site/side was marked, verified correct patient position, special equipment/implants available, medications/allergies/relevant history reviewed, required imaging and test results available.   Sterile Technique Maximal sterile technique including full sterile barrier drape, hand hygiene, sterile gown, sterile gloves, mask, hair covering, sterile ultrasound probe cover (if used).  Procedure Description Ultrasound was used to identify appropriate pleural anatomy for placement and overlying skin marked.  Area of drainage cleaned and draped in sterile fashion. Lidocaine was used to anesthetize the skin and subcutaneous tissue.  1000 cc's of serous appearing fluid was drained from the right pleural space. Catheter then removed and bandaid applied to site.   Complications/Tolerance None; patient tolerated the procedure well. Chest X-ray is ordered to confirm no post-procedural complication. She had significant pain during and after the procedure despite using 10 cc lidocaine.   EBL Minimal   Specimen(s) Pleural fluid

## 2022-12-28 NOTE — Interval H&P Note (Signed)
Patient presents for thoracentesis for recurrent pleural effusion. Eliquis held. Agreeable to proceed without contraindication.  Durel Salts, MD Pulmonary and Critical Care Medicine [facility] 12/28/2022 3:18 PM Pager: see AMION  If no response to pager, please call critical care on call (see AMION) until 7pm After 7:00 pm call Elink

## 2022-12-28 NOTE — Telephone Encounter (Signed)
This would have to be changed by the provider

## 2022-12-28 NOTE — Progress Notes (Signed)
Dr. Celine Mans reviewed xray. No pneumothorax per Dr. Celine Mans. Ok to send patient home with daughter. Pt. Discharged per orders. Daughter knows to resume eliquis tomorrow

## 2022-12-29 LAB — CYTOLOGY - NON PAP

## 2022-12-29 NOTE — Telephone Encounter (Signed)
Left detailed message on Rene's VM with CPT code, 40981

## 2022-12-29 NOTE — Telephone Encounter (Signed)
Pt has already had procedure performed. Closing encounter.

## 2022-12-31 ENCOUNTER — Encounter (HOSPITAL_COMMUNITY): Payer: Self-pay | Admitting: Internal Medicine

## 2022-12-31 LAB — BODY FLUID CULTURE W GRAM STAIN: Culture: NO GROWTH

## 2023-01-02 ENCOUNTER — Other Ambulatory Visit: Payer: Self-pay | Admitting: Pulmonary Disease

## 2023-01-02 ENCOUNTER — Telehealth: Payer: Self-pay | Admitting: Pulmonary Disease

## 2023-01-02 NOTE — Telephone Encounter (Signed)
Call patient  Encourage to follow-up with cardiology  Did see cardiology in 2022 at [facility]-she should make sure she follows up.  Heart failure is likely the biggest factor contributing to recurrent pleural effusion

## 2023-01-03 NOTE — Telephone Encounter (Signed)
Spoke with patient's daughter Gilmore Laroche regarding Dr.Olalere's note   Call patient   Encourage to follow-up with cardiology   Did see cardiology in 2022 at [facility]-she should make sure she follows up.   Heart failure is likely the biggest factor contributing to recurrent pleural effusion    Patient's daughter is going to call Cardiologist . Nothing else further needed.

## 2023-03-03 IMAGING — US US THORACENTESIS ASP PLEURAL SPACE W/IMG GUIDE
1 series · 4 of 4 positions shown · non-contrast
Comparison: none

INDICATION: Patient with history of CHF, AFib on Eliquis, pulmonary
hypertension, dyspnea and recurrent right pleural effusion. Request
received for diagnostic and therapeutic right thoracentesis.

[Series 1: us thoracentesis asp pleural space w/img guide · 4 of 4 slices shown]
[im 1/4]
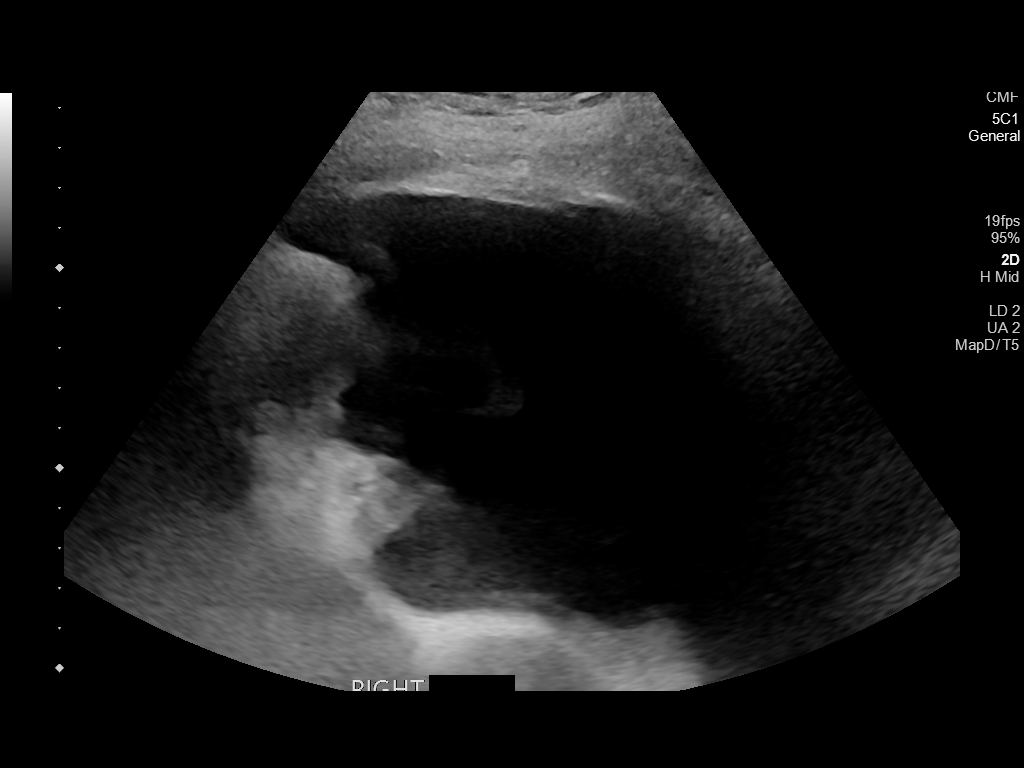
[im 2/4]
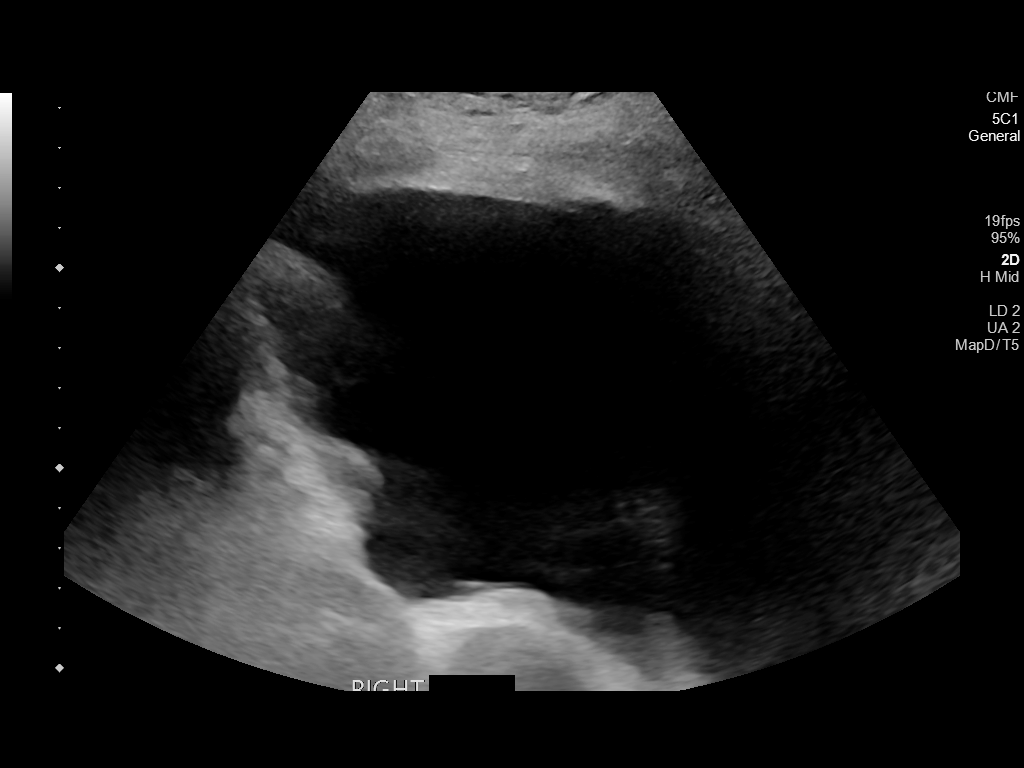
[im 3/4]
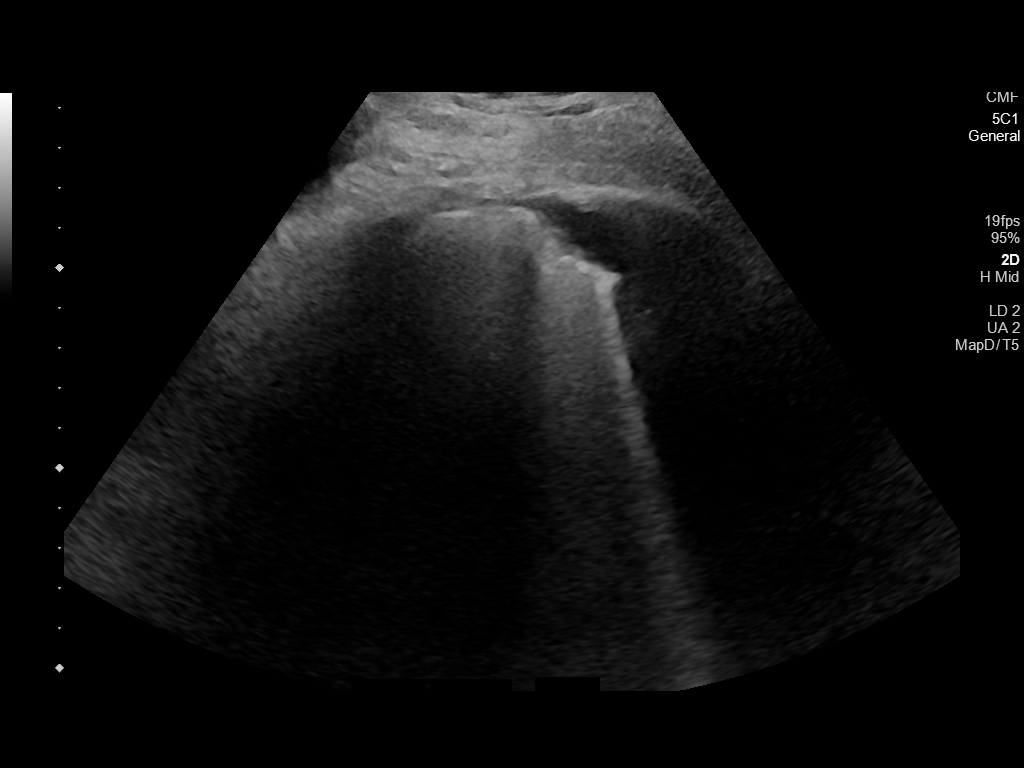
[im 4/4]
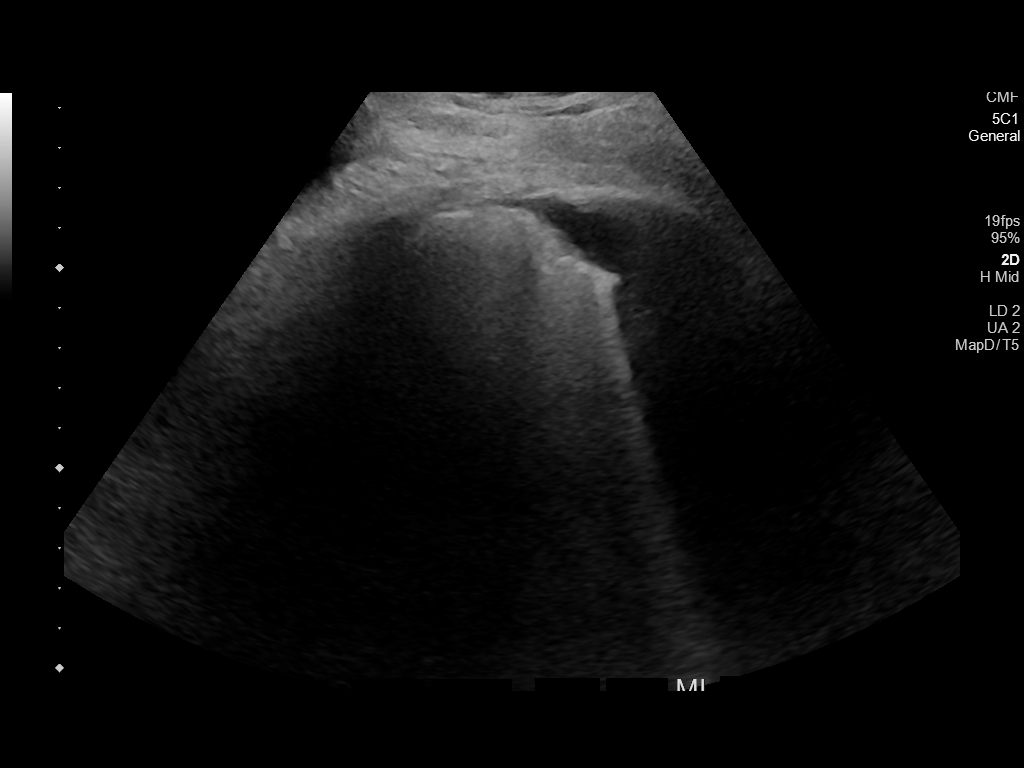

[4 of 4 positions shown; findings below may reference images not displayed]

EXAM:
ULTRASOUND GUIDED DIAGNOSTIC AND THERAPEUTIC RIGHT THORACENTESIS

MEDICATIONS:
10 mL 1 % lidocaine

COMPLICATIONS:
None immediate.

PROCEDURE:
An ultrasound guided thoracentesis was thoroughly discussed with the
patient and questions answered. The benefits, risks, alternatives
and complications were also discussed. The patient understands and
wishes to proceed with the procedure. Written consent was obtained.

Ultrasound was performed to localize and mark an adequate pocket of
fluid in the right chest. The area was then prepped and draped in
the normal sterile fashion. 1% Lidocaine was used for local
anesthesia. Under ultrasound guidance a 6 Fr Safe-T-Centesis
catheter was introduced. Thoracentesis was performed. The catheter
was removed and a dressing applied.
FINDINGS: A total of approximately 1 L of cloudy, amber fluid was removed.
Samples were sent to the laboratory as requested by the clinical
team.
IMPRESSION: Successful ultrasound guided right thoracentesis yielding 1 L of
pleural fluid.

## 2023-10-30 ENCOUNTER — Telehealth: Payer: Self-pay

## 2023-10-30 NOTE — Telephone Encounter (Signed)
 Copied from CRM [id number]. Topic: Clinical - Order For Equipment >> Oct 24, 2023  2:39 PM Corean SAUNDERS wrote: Reason for CRM: Patients daughter is requesting Dr. Neda to please order the patient a over the shoulder oxygen concentrator to [employer]. Please call patients daughter Gregoria with an update.  Spoke with patient's daughter Gregoria regarding prior message . Gregoria  will contact our office back they are at a funeral .

## 2023-12-20 ENCOUNTER — Ambulatory Visit: Admitting: Pulmonary Disease

## 2023-12-21 ENCOUNTER — Encounter: Payer: Self-pay | Admitting: Pulmonary Disease

## 2024-02-18 ENCOUNTER — Ambulatory Visit: Admitting: Primary Care

## 2024-02-18 DIAGNOSIS — J90 Pleural effusion, not elsewhere classified: Secondary | ICD-10-CM

## 2024-02-18 DIAGNOSIS — J45909 Unspecified asthma, uncomplicated: Secondary | ICD-10-CM

## 2024-02-29 ENCOUNTER — Telehealth: Payer: Self-pay | Admitting: Primary Care

## 2024-02-29 ENCOUNTER — Telehealth (INDEPENDENT_AMBULATORY_CARE_PROVIDER_SITE_OTHER): Admitting: Primary Care

## 2024-02-29 DIAGNOSIS — J45909 Unspecified asthma, uncomplicated: Secondary | ICD-10-CM

## 2024-02-29 DIAGNOSIS — Z8709 Personal history of other diseases of the respiratory system: Secondary | ICD-10-CM | POA: Diagnosis not present

## 2024-02-29 DIAGNOSIS — G4733 Obstructive sleep apnea (adult) (pediatric): Secondary | ICD-10-CM | POA: Diagnosis not present

## 2024-02-29 DIAGNOSIS — J9610 Chronic respiratory failure, unspecified whether with hypoxia or hypercapnia: Secondary | ICD-10-CM | POA: Diagnosis not present

## 2024-02-29 DIAGNOSIS — J90 Pleural effusion, not elsewhere classified: Secondary | ICD-10-CM

## 2024-02-29 NOTE — Patient Instructions (Signed)
  VISIT SUMMARY: Today, you came in for a follow-up on your pleural effusion and respiratory status. You were accompanied by your daughter, Walton. We discussed your breathing difficulties, recent hospital stays, and current medications. We also reviewed your severe back pain and mobility issues due to a mid-back fracture. Additionally, we talked about your suspected sleep apnea and the need for a sleep study.  YOUR PLAN: -CHRONIC RESPIRATORY FAILURE WITH RIGHT-SIDED PLEURAL EFFUSION AND ASTHMA: Chronic respiratory failure means your lungs are not getting enough oxygen into your blood. Pleural effusion is a buildup of fluid around your lungs, and asthma is a condition that makes it hard to breathe. You should use your Symbicort  inhaler twice daily and we have ordered a nebulizer for you to use at home. Please check your oxygen levels during physical activities and wear your oxygen during sleep until we get the sleep study results. We will also order a chest x-ray before your next visit.  -SEVERE SLEEP APNEA (SUSPECTED): Sleep apnea is a condition where you stop breathing for short periods while you sleep. We suspect you have severe sleep apnea and have ordered a home sleep study since an in-lab study is challenging due to your mobility issues.  -MOBILITY IMPAIRMENT DUE TO THORACIC COMPRESSION FRACTURE: A thoracic compression fracture is a break in one of the bones in your mid-back, causing severe pain and difficulty moving. Continue using your back brace and take your pain medications, hydrocodone  and tramadol, as prescribed. Try to remain still to prevent further injury.  INSTRUCTIONS: Please follow up with a chest x-ray before your next office visit. We have also ordered a home sleep study for you. Make sure to use your Symbicort  inhaler twice daily, and use the nebulizer once it arrives. Check your oxygen levels during physical activities and wear your oxygen during sleep. Continue using your back  brace and take your pain medications as prescribed.   Follow-up 6 weeks in person with Dr. Neda- come 30 min early for CXR prior

## 2024-02-29 NOTE — Telephone Encounter (Signed)
 Patient needs in person follow-up with Dr. Neda in 6-7 weeks, CXR prior

## 2024-02-29 NOTE — Progress Notes (Signed)
 Virtual Visit via Video Note  I connected with Annette Moore on 02/29/24 at  4:00 PM EST by a video enabled telemedicine application and verified that I am speaking with the correct person using two identifiers.  Location: Patient: Home Provider: Office    I discussed the limitations of evaluation and management by telemedicine and the availability of in person appointments. The patient expressed understanding and agreed to proceed.  History of Present Illness: 78 year old female, former smoker quit 1978.  Past medical history significant for diastolic heart failure, history of asthma, right pleural effusion, sleep apnea, Lap-Band surgery, morbid obesity.  Patient of Dr. Neda.   02/29/2024- Interim hx  Discussed the use of AI scribe software for clinical note transcription with the patient, who gave verbal consent to proceed.  History of Present Illness Annette Moore is a 78 year old female with asthma, pleural effusion, and chronic respiratory failure who presents for a follow-up on her pleural effusion and respiratory status. She is accompanied by her daughter, Walton, who is her primary caregiver.  She experiences intermittent breathing difficulties, particularly when not taking her medications consistently. She reported using her Symbicort  inhaler more regularly during her recent hospital stay. Currently, she uses Symbicort  as needed and has a nebulizer with albuterol for additional shortness of breath. She is on 3 liters of oxygen via an oxygen concentrator at home, primarily using it when needed. Her daughter reports that she does not have a nebulizer machine at home, only the oxygen concentrator.  She has had multiple hospital admissions recently, including from October 24th to October 30th and September 1st to September 23rd, family thinks she was admitted at [facility]. During these admissions, she was treated for elevated ammonia levels, a UTI,  and gastrointestinal issues. She also had a procedure to investigate intestinal concerns and was found to have low magnesium and potassium levels.  She has a mid-back fracture of unknown origin and is currently using a back brace. Her mobility has declined due to prolonged hospital stays, and she is currently in a hospital bed at home, experiencing severe back pain. She has been prescribed tramadol and hydrocodone  for pain management.  Her current medications include Eliquis, atorvastatin, colchicine , gabapentin (100 mg three times daily), magnesium oxide, metoprolol (12.5 mg, half a tablet twice daily), midodrine (5 mg twice daily), a multivitamin with minerals, Senna, vitamin B1, tramadol (50 mg as needed every 4-6 hours), and hydrocodone  (10/325 mg every 4 hours as needed).  She has a history of severe sleep apnea but has not been using a CPAP machine. A previous sleep study was canceled due to a hospital admission, and it has not been rescheduled. Her daughter reports that she has not checked her oxygen levels at home, and she was not on oxygen during her recent hospital stays.  Notes from most recent hospitalization are not available for review in EMR or care-everywhere    Observations/Objective:  Laying in bed, alert and oriented. Able to speak in full sentences without respiratory distress. Patient was not wearing oxygen.  - Unable to check vitals   Assessment and Plan:  Assessment and Plan Assessment & Plan Chronic respiratory failure with asthma Intermittent dyspnea, particularly when not using inhalers. Recent hospitalizations for non-respiratory issues. Oxygen saturation levels are reportedly normal. Inconsistent use of Symbicort  inhaler. No nebulizer available at home. - Instructed to use Symbicort  80mcg two puffs twice daily; Albuterol hfa/neb every 4-6 hours for breakthrough shortness of breath/wheezing  - Ordered nebulizer device for home  use. - Advised to check oxygen levels  during exertion. - Continue oxygen with exertion and at night while sleeping to maintain O2 >88-90%  Hx right-sided pleural effusion with prior thoracentesis - Needs in office evaluation with CXR prior to reassess  Severe sleep apnea Hx severe sleep apnea, not currently on CPAP. Mobility issues make in-lab sleep study challenging. - Ordered home sleep study to re-assess OSA, last sleep study was done in January 2023  - Advised patient wear 3L oxygen at night   Mobility impairment due to thoracic compression fracture Thoracic compression fracture causing severe pain and mobility impairment. Currently using a back brace and on Calcitonin nasal spray. Following with neurosurgery. Advised to remain still to prevent further injury. Pain management includes hydrocodone  and tramadol. - Continue current pain management regimen with hydrocodone  and tramadol. - Continue use of back brace.  I personally spent a total of 40 minutes in the care of the patient today including getting/reviewing separately obtained history, counseling and educating, placing orders, documenting clinical information in the EHR, and coordinating care.   Follow Up Instructions:   6 weeks with Dr. Olalere - CXR prior   I discussed the assessment and treatment plan with the patient. The patient was provided an opportunity to ask questions and all were answered. The patient agreed with the plan and demonstrated an understanding of the instructions.   The patient was advised to call back or seek an in-person evaluation if the symptoms worsen or if the condition fails to improve as anticipated.  I provided  minutes of non-face-to-face time during this encounter.   Almarie LELON Ferrari, NP

## 2024-03-03 NOTE — Telephone Encounter (Signed)
 Pt has been scheduled ov with Dr. Neda and pt was advised to arrive early for an X-ray priot to appt.  Nfn

## 2024-03-04 ENCOUNTER — Telehealth: Payer: Self-pay

## 2024-03-04 NOTE — Telephone Encounter (Signed)
 CMN received from [facility] regarding pt's nebulizer. This is only requiring a provider signature. I will fax once signed.

## 2024-04-08 ENCOUNTER — Telehealth: Payer: Self-pay

## 2024-04-08 NOTE — Telephone Encounter (Signed)
 Letter received from [facility] stating they were not successful in reaching pt regarding the set up of the nebulizer. They have cancelled the order. NFN

## 2024-04-21 ENCOUNTER — Ambulatory Visit

## 2024-04-21 ENCOUNTER — Ambulatory Visit: Admitting: Pulmonary Disease

## 2024-04-21 ENCOUNTER — Encounter: Payer: Self-pay | Admitting: Pulmonary Disease
